# Patient Record
Sex: Female | Born: 1995 | Race: White | Hispanic: No | Marital: Single | Smoking: Never smoker
Health system: Southern US, Community
[De-identification: ages and names within clinical notes are randomized; demographics above are authoritative.]

## PROBLEM LIST (undated history)

## (undated) DIAGNOSIS — F419 Anxiety disorder, unspecified: Secondary | ICD-10-CM

## (undated) HISTORY — DX: Anxiety disorder, unspecified: F41.9

## (undated) HISTORY — PX: WISDOM TOOTH EXTRACTION: SHX21

---
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
## 2017-10-25 ENCOUNTER — Ambulatory Visit
Admission: RE | Admit: 2017-10-25 | Discharge: 2017-10-25 | Disposition: A | Discharge status: Home / Self Care | Payer: BLUE CROSS/BLUE SHIELD | Source: Ambulatory Visit | Attending: Family Medicine | Admitting: Family Medicine

## 2017-10-25 ENCOUNTER — Other Ambulatory Visit: Payer: Self-pay | Admitting: Family Medicine

## 2017-10-25 DIAGNOSIS — R079 Chest pain, unspecified: Secondary | ICD-10-CM | POA: Diagnosis present

## 2018-03-16 ENCOUNTER — Emergency Department: Payer: 59

## 2018-03-16 ENCOUNTER — Emergency Department
Admission: EM | Admit: 2018-03-16 | Discharge: 2018-03-17 | Disposition: A | Discharge status: Home / Self Care | Payer: 59 | Attending: Emergency Medicine | Admitting: Emergency Medicine

## 2018-03-16 ENCOUNTER — Other Ambulatory Visit: Payer: Self-pay

## 2018-03-16 DIAGNOSIS — X58XXXA Exposure to other specified factors, initial encounter: Secondary | ICD-10-CM | POA: Insufficient documentation

## 2018-03-16 DIAGNOSIS — R0789 Other chest pain: Secondary | ICD-10-CM | POA: Insufficient documentation

## 2018-03-16 DIAGNOSIS — S0083XA Contusion of other part of head, initial encounter: Secondary | ICD-10-CM | POA: Insufficient documentation

## 2018-03-16 DIAGNOSIS — F10120 Alcohol abuse with intoxication, uncomplicated: Secondary | ICD-10-CM

## 2018-03-16 DIAGNOSIS — Y929 Unspecified place or not applicable: Secondary | ICD-10-CM | POA: Insufficient documentation

## 2018-03-16 DIAGNOSIS — Y939 Activity, unspecified: Secondary | ICD-10-CM | POA: Diagnosis not present

## 2018-03-16 DIAGNOSIS — Y999 Unspecified external cause status: Secondary | ICD-10-CM | POA: Insufficient documentation

## 2018-03-16 DIAGNOSIS — K2920 Alcoholic gastritis without bleeding: Secondary | ICD-10-CM | POA: Diagnosis not present

## 2018-03-16 DIAGNOSIS — T510X1A Toxic effect of ethanol, accidental (unintentional), initial encounter: Secondary | ICD-10-CM | POA: Insufficient documentation

## 2018-03-16 LAB — URINALYSIS, COMPLETE (UACMP) WITH MICROSCOPIC
BACTERIA UA: NONE SEEN
Bilirubin Urine: NEGATIVE
Glucose, UA: NEGATIVE mg/dL
Hgb urine dipstick: NEGATIVE
KETONES UR: NEGATIVE mg/dL
LEUKOCYTES UA: NEGATIVE
Nitrite: NEGATIVE
PH: 6 (ref 5.0–8.0)
PROTEIN: NEGATIVE mg/dL
Specific Gravity, Urine: 1.028 (ref 1.005–1.030)

## 2018-03-16 LAB — COMPREHENSIVE METABOLIC PANEL
ALBUMIN: 3.9 g/dL (ref 3.5–5.0)
ALT: 19 U/L (ref 0–44)
AST: 28 U/L (ref 15–41)
Alkaline Phosphatase: 49 U/L (ref 38–126)
Anion gap: 4 — ABNORMAL LOW (ref 5–15)
BUN: 11 mg/dL (ref 6–20)
CO2: 28 mmol/L (ref 22–32)
CREATININE: 0.94 mg/dL (ref 0.44–1.00)
Calcium: 9.1 mg/dL (ref 8.9–10.3)
Chloride: 105 mmol/L (ref 98–111)
GFR calc Af Amer: >60 mL/min (ref >60)
GLUCOSE: 119 mg/dL — AB (ref 70–99)
POTASSIUM: 4 mmol/L (ref 3.5–5.1)
Sodium: 137 mmol/L (ref 135–145)
Total Bilirubin: 0.5 mg/dL (ref 0.3–1.2)
Total Protein: 7 g/dL (ref 6.5–8.1)

## 2018-03-16 LAB — URINE DRUG SCREEN, QUALITATIVE (ARMC ONLY)
AMPHETAMINES, UR SCREEN: NOT DETECTED
Barbiturates, Ur Screen: NOT DETECTED
CANNABINOID 50 NG, UR ~~LOC~~: NOT DETECTED
COCAINE METABOLITE, UR ~~LOC~~: NOT DETECTED
MDMA (ECSTASY) UR SCREEN: NOT DETECTED
Methadone Scn, Ur: NOT DETECTED
Opiate, Ur Screen: NOT DETECTED
Phencyclidine (PCP) Ur S: NOT DETECTED
Tricyclic, Ur Screen: NOT DETECTED

## 2018-03-16 LAB — CBC WITH DIFFERENTIAL/PLATELET
BASOS ABS: 0 10*3/uL (ref 0–0.1)
Basophils Relative: 0 %
Eosinophils Absolute: 0.1 10*3/uL (ref 0–0.7)
Eosinophils Relative: 1 %
HCT: 39.3 % (ref 35.0–47.0)
HEMOGLOBIN: 13.6 g/dL (ref 12.0–16.0)
Lymphocytes Relative: 22 %
Lymphs Abs: 2.8 10*3/uL (ref 1.0–3.6)
MCH: 30.3 pg (ref 26.0–34.0)
MCHC: 34.5 g/dL (ref 32.0–36.0)
MCV: 87.8 fL (ref 80.0–100.0)
Monocytes Absolute: 1.1 10*3/uL — ABNORMAL HIGH (ref 0.2–0.9)
Monocytes Relative: 8 %
Neutro Abs: 9.1 10*3/uL — ABNORMAL HIGH (ref 1.4–6.5)
Neutrophils Relative %: 69 %
Platelets: 441 10*3/uL — ABNORMAL HIGH (ref 150–440)
RBC: 4.48 MIL/uL (ref 3.80–5.20)
RDW: 12.2 % (ref 11.5–14.5)
WBC: 13.1 10*3/uL — AB (ref 3.6–11.0)

## 2018-03-16 LAB — ETHANOL

## 2018-03-16 LAB — TROPONIN I

## 2018-03-16 LAB — POCT PREGNANCY, URINE: Preg Test, Ur: NEGATIVE

## 2018-03-16 LAB — PREGNANCY, URINE: Preg Test, Ur: NEGATIVE

## 2018-03-16 MED ORDER — DEXTROSE-NACL 5-0.45 % IV SOLN
INTRAVENOUS | Status: DC
  Administered 2018-03-17: 01:00:00 via INTRAVENOUS

## 2018-03-16 MED ORDER — ONDANSETRON HCL 4 MG/2ML IJ SOLN
4.0000 mg | Freq: Once | INTRAMUSCULAR | Status: DC
  Filled 2018-03-16: qty 2

## 2018-03-16 MED ORDER — FAMOTIDINE IN NACL 20-0.9 MG/50ML-% IV SOLN
20.0000 mg | Freq: Once | INTRAVENOUS | Status: DC
  Filled 2018-03-16: qty 50

## 2018-03-16 NOTE — ED Provider Notes (Signed)
[facility] Emergency Department Provider Note   ____________________________________________   None    (approximate)  I have reviewed the triage vital signs and the nursing notes.   HISTORY  Chief Complaint possible ingestion    HPI Angela Campbell is a 22 y.o. female who presents [facility] the ED from [facility] with a chief complaint of chest pain and feeling groggy.  Reports she went out with friends last evening with heavy drinking.  Thinks she might have fallen because she noticed a bruise on her forehead today.  Awoke with burning epigastric discomfort and noted left-sided chest pain throughout the day.  States she feels groggy and cannot really remember anything.  Denies ingesting more alcohol than usual and does not feel like she is merely having a hangover.  She wonders if someone slipped something into her drink.  "Just feel out of it".  Denies recent fever, chills, cough, shortness of breath, vomiting, dysuria, diarrhea.  Takes OCPs.   Past medical history None  There are no active problems to display for this patient.    Prior to Admission medications   Not on File    Allergies Patient has no known allergies.  No family history on file.  Social History Social History   Tobacco Use  . Smoking status: Not on file  Substance Use Topics  . Alcohol use: Not on file  . Drug use: Not on file  +EtOH  Review of Systems  Constitutional: No fever/chills Eyes: No visual changes. ENT: No sore throat. Cardiovascular: Positive for chest pain. Respiratory: Denies shortness of breath. Gastrointestinal: No abdominal pain.  No nausea, no vomiting.  No diarrhea.  No constipation. Genitourinary: Negative for dysuria. Musculoskeletal: Negative for back pain. Skin: Negative for rash. Neurological: Positive for feeling groggy and "out of it".  Negative for headaches, focal weakness or  numbness.   ____________________________________________   PHYSICAL EXAM:  VITAL SIGNS: ED Triage Vitals  Enc Vitals Group     BP 03/16/18 2108 124/65     Pulse Rate 03/16/18 2108 64     Resp 03/16/18 2108 18     Temp 03/16/18 2108 98.3 F (36.8 C)     Temp Source 03/16/18 2108 Oral     SpO2 03/16/18 2108 100 %     Weight 03/16/18 2108 153 lb (69.4 kg)     Height 03/16/18 2108 5\' 5"  (1.651 m)     Head Circumference --      Peak Flow --      Pain Score 03/16/18 2117 7     Pain Loc --      Pain Edu? --      Excl. in GC? --     Constitutional: Alert and oriented. Well appearing and in no acute distress.  Laughing and visiting with friends. Eyes: Conjunctivae are normal. PERRL. EOMI. Head: Atraumatic.  No evidence of bruising noted. Nose: No external evidence of injury. Mouth/Throat: Mucous membranes are moist.  No dental malocclusion. Neck: No stridor.  No cervical spine tenderness to palpation. Cardiovascular: Normal rate, regular rhythm. Grossly normal heart sounds.  Good peripheral circulation. Respiratory: Normal respiratory effort.  No retractions. Lungs CTAB. Gastrointestinal: Soft and nontender to light or deep palpation. No distention. No abdominal bruits. No CVA tenderness. Musculoskeletal: No lower extremity tenderness nor edema.  No joint effusions. Neurologic: Alert and oriented x3.  CN II to XII grossly intact.  Normal speech and language. No gross focal neurologic deficits are appreciated. No gait instability. Skin:  Skin is warm, dry and intact. No rash noted.  No external evidence of injury noted. Psychiatric: Mood and affect are normal. Speech and behavior are normal.  ____________________________________________   LABS (all labs ordered are listed, but only abnormal results are displayed)  Labs Reviewed  CBC WITH DIFFERENTIAL/PLATELET - Abnormal; Notable for the following components:      Result Value   WBC 13.1 (*)    Platelets 441 (*)    Neutro Abs  9.1 (*)    Monocytes Absolute 1.1 (*)    All other components within normal limits  URINE DRUG SCREEN, QUALITATIVE ([facility] ONLY) - Abnormal; Notable for the following components:   Benzodiazepine, Ur Scrn TEST NOT PERFORMED, REAGENT NOT AVAILABLE (*)    All other components within normal limits  COMPREHENSIVE METABOLIC PANEL - Abnormal; Notable for the following components:   Glucose, Bld 119 (*)    Anion gap 4 (*)    All other components within normal limits  URINALYSIS, COMPLETE (UACMP) WITH MICROSCOPIC - Abnormal; Notable for the following components:   Color, Urine YELLOW (*)    APPearance CLEAR (*)    All other components within normal limits  PREGNANCY, URINE  TROPONIN I  ETHANOL  TROPONIN I  FIBRIN DERIVATIVES D-DIMER ([facility] ONLY)  POCT PREGNANCY, URINE   ____________________________________________  EKG  ED ECG REPORT I, SUNG,JADE J, the attending physician, personally viewed and interpreted this ECG.   Date: 03/16/2018  EKG Time: 2105  Rate: 55  Rhythm: sinus bradycardia  Axis: Normal  Intervals:none  ST&T Change: Nonspecific  ____________________________________________  RADIOLOGY  ED MD interpretation: No acute cardiopulmonary process; no ICH  Official radiology report(s): Dg Chest 2 View  Result Date: 03/16/2018 CLINICAL DATA:  Pt states she was drinking last pm when she drank something and then felt "disoriented and I couldn't really remember anything". Pt states she was vomiting and felt groggy. Pt states she also had chest pain today. EXAM: CHEST - 2 VIEW COMPARISON:  10/25/2017 FINDINGS: The heart size and mediastinal contours are within normal limits. Both lungs are clear. The visualized skeletal structures are unremarkable. IMPRESSION: No active cardiopulmonary disease. Electronically Signed   By: Norva PavlovElizabeth  Brown M.D.   On: 03/16/2018 21:43   Ct Head Wo Contrast  Result Date: 03/17/2018 CLINICAL DATA:  22 year old female with ETOH and fall. EXAM: CT  HEAD WITHOUT CONTRAST TECHNIQUE: Contiguous axial images were obtained from the base of the skull through the vertex without intravenous contrast. COMPARISON:  None. FINDINGS: Brain: No evidence of acute infarction, hemorrhage, hydrocephalus, extra-axial collection or mass lesion/mass effect. Vascular: No hyperdense vessel or unexpected calcification. Skull: Normal. Negative for fracture or focal lesion. Sinuses/Orbits: No acute finding. Other: None IMPRESSION: Normal noncontrast CT of the brain. Electronically Signed   By: Elgie CollardArash  Radparvar M.D.   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 Differential diagnosis includes, but is not limited to, ICH, head injury, CVA, accidental drug ingestion, ACS, aortic dissection, pulmonary embolism, cardiac tamponade, pneumothorax, pneumonia, pericarditis, myocarditis,  GI-related causes including esophagitis/gastritis, and musculoskeletal chest wall pain.    Initial work-up including troponin and EKG negative.  UDS negative.  Will check EtOH, urinalysis, repeat troponin and add d-dimer as patient is taking OCPs.  Will initiate IV fluid resuscitation, 20 mg IV Pepcid for likely alcoholic gastritis and reassess.  Clinical Course as of Mar 17 121  Sat Mar 17, 2018  0118 Patient declined IV Pepcid and Zofran.  She is overall feeling much better and is just eager to go home.   Updated her on negative repeat troponin, d-dimer and CT head results.  Encouraged her to increase her hydration over the next few days and to take over-the-counter Zantac or Pepcid as needed.  Strict return precautions given.  Patient verbalizes understanding and agrees with plan of care.   [JS]    Clinical Course User Index [JS] Irean Hong, MD     ____________________________________________   FINAL CLINICAL IMPRESSION(S) / ED DIAGNOSES  Final diagnoses:  Hangover effect, uncomplicated (HCC)  Acute alcoholic gastritis without hemorrhage     ED Discharge Orders    None       Note:  This document was prepared using Dragon voice recognition software and may include unintentional dictation errors.   Irean Hong, MD 03/17/18 [phone]

## 2018-03-16 NOTE — ED Triage Notes (Signed)
Pt states she was drinking last pm when she drank something and then felt "disoriented and I couldn't really remember anything". Pt states she was vomiting and felt groggy. Pt states she also had chest pain today. Pt states "I just feel out of it".

## 2018-03-17 LAB — TROPONIN I

## 2018-03-17 LAB — FIBRIN DERIVATIVES D-DIMER (ARMC ONLY): Fibrin derivatives D-dimer (ARMC): 229.55 ng/mL (FEU) (ref 0.00–499.00)

## 2018-03-17 NOTE — Discharge Instructions (Signed)
Drink plenty of fluids this weekend.  You may take over-the-counter Pepcid or Zantac as needed. Return to the ER for worsening symptoms, persistent vomiting, difficulty breathing or other concerns.

## 2018-05-15 ENCOUNTER — Emergency Department: Payer: BLUE CROSS/BLUE SHIELD | Admitting: Certified Registered Nurse Anesthetist

## 2018-05-15 ENCOUNTER — Other Ambulatory Visit: Payer: Self-pay

## 2018-05-15 ENCOUNTER — Emergency Department: Payer: BLUE CROSS/BLUE SHIELD

## 2018-05-15 ENCOUNTER — Encounter: Payer: Self-pay | Admitting: Emergency Medicine

## 2018-05-15 ENCOUNTER — Observation Stay
Admission: EM | Admit: 2018-05-15 | Discharge: 2018-05-16 | Disposition: A | Discharge status: Home / Self Care | Payer: BLUE CROSS/BLUE SHIELD | Attending: Surgery | Admitting: Surgery

## 2018-05-15 ENCOUNTER — Encounter
Admission: EM | Disposition: A | Discharge status: Home / Self Care | Payer: Self-pay | Source: Home / Self Care | Attending: Emergency Medicine

## 2018-05-15 DIAGNOSIS — K3580 Unspecified acute appendicitis: Secondary | ICD-10-CM | POA: Diagnosis present

## 2018-05-15 DIAGNOSIS — K353 Acute appendicitis with localized peritonitis, without perforation or gangrene: Secondary | ICD-10-CM | POA: Diagnosis not present

## 2018-05-15 HISTORY — PX: LAPAROSCOPIC APPENDECTOMY: SHX408

## 2018-05-15 LAB — CBC
HCT: 38.6 % (ref 36.0–46.0)
HEMOGLOBIN: 12.8 g/dL (ref 12.0–15.0)
MCH: 30.3 pg (ref 26.0–34.0)
MCHC: 33.2 g/dL (ref 30.0–36.0)
MCV: 91.5 fL (ref 80.0–100.0)
Platelets: 364 10*3/uL (ref 150–400)
RBC: 4.22 MIL/uL (ref 3.87–5.11)
RDW: 12.3 % (ref 11.5–15.5)
WBC: 15.9 10*3/uL — AB (ref 4.0–10.5)
nRBC: 0 % (ref 0.0–0.2)

## 2018-05-15 LAB — COMPREHENSIVE METABOLIC PANEL
ALT: 18 U/L (ref 0–44)
ANION GAP: 8 (ref 5–15)
AST: 20 U/L (ref 15–41)
Albumin: 3.9 g/dL (ref 3.5–5.0)
Alkaline Phosphatase: 57 U/L (ref 38–126)
BILIRUBIN TOTAL: 0.5 mg/dL (ref 0.3–1.2)
BUN: 10 mg/dL (ref 6–20)
CO2: 25 mmol/L (ref 22–32)
Calcium: 8.8 mg/dL — ABNORMAL LOW (ref 8.9–10.3)
Chloride: 102 mmol/L (ref 98–111)
Creatinine, Ser: 0.77 mg/dL (ref 0.44–1.00)
Glucose, Bld: 92 mg/dL (ref 70–99)
POTASSIUM: 3.4 mmol/L — AB (ref 3.5–5.1)
Sodium: 135 mmol/L (ref 135–145)
TOTAL PROTEIN: 7.2 g/dL (ref 6.5–8.1)

## 2018-05-15 LAB — URINALYSIS, COMPLETE (UACMP) WITH MICROSCOPIC
BILIRUBIN URINE: NEGATIVE
Glucose, UA: NEGATIVE mg/dL
Hgb urine dipstick: NEGATIVE
KETONES UR: NEGATIVE mg/dL
NITRITE: NEGATIVE
PH: 6 (ref 5.0–8.0)
PROTEIN: NEGATIVE mg/dL
Specific Gravity, Urine: 1.01 (ref 1.005–1.030)

## 2018-05-15 LAB — LIPASE, BLOOD: Lipase: 25 U/L (ref 11–51)

## 2018-05-15 LAB — POCT PREGNANCY, URINE: Preg Test, Ur: NEGATIVE

## 2018-05-15 SURGERY — APPENDECTOMY, LAPAROSCOPIC
Anesthesia: General | Site: Abdomen

## 2018-05-15 MED ORDER — SUCCINYLCHOLINE CHLORIDE 20 MG/ML IJ SOLN
INTRAMUSCULAR | Status: DC | PRN
  Administered 2018-05-15: 100 mg via INTRAVENOUS

## 2018-05-15 MED ORDER — MIDAZOLAM HCL 2 MG/2ML IJ SOLN
INTRAMUSCULAR | Status: AC
  Filled 2018-05-15: qty 2

## 2018-05-15 MED ORDER — KETOROLAC TROMETHAMINE 30 MG/ML IJ SOLN
INTRAMUSCULAR | Status: AC
  Filled 2018-05-15: qty 1

## 2018-05-15 MED ORDER — SODIUM CHLORIDE 0.9 % IV SOLN
INTRAVENOUS | Status: AC
  Filled 2018-05-15: qty 10

## 2018-05-15 MED ORDER — FENTANYL CITRATE (PF) 100 MCG/2ML IJ SOLN
INTRAMUSCULAR | Status: DC | PRN
  Administered 2018-05-15 (×3): 50 ug via INTRAVENOUS
  Administered 2018-05-16 (×2): 25 ug via INTRAVENOUS

## 2018-05-15 MED ORDER — ONDANSETRON HCL 4 MG/2ML IJ SOLN
INTRAMUSCULAR | Status: AC
  Filled 2018-05-15: qty 2

## 2018-05-15 MED ORDER — PIPERACILLIN-TAZOBACTAM 3.375 G IVPB 30 MIN: 3.3750 g | Freq: Once | INTRAVENOUS | Status: DC

## 2018-05-15 MED ORDER — DEXAMETHASONE SODIUM PHOSPHATE 10 MG/ML IJ SOLN
INTRAMUSCULAR | Status: DC | PRN
  Administered 2018-05-15: 10 mg via INTRAVENOUS

## 2018-05-15 MED ORDER — ACETAMINOPHEN 10 MG/ML IV SOLN
INTRAVENOUS | Status: AC
  Filled 2018-05-15: qty 100

## 2018-05-15 MED ORDER — MIDAZOLAM HCL 2 MG/2ML IJ SOLN
INTRAMUSCULAR | Status: DC | PRN
  Administered 2018-05-15: 2 mg via INTRAVENOUS

## 2018-05-15 MED ORDER — FENTANYL CITRATE (PF) 100 MCG/2ML IJ SOLN
INTRAMUSCULAR | Status: AC
  Filled 2018-05-15: qty 2

## 2018-05-15 MED ORDER — BUPIVACAINE-EPINEPHRINE (PF) 0.5% -1:200000 IJ SOLN
INTRAMUSCULAR | Status: AC
  Filled 2018-05-15: qty 30

## 2018-05-15 MED ORDER — PROPOFOL 10 MG/ML IV BOLUS
INTRAVENOUS | Status: DC | PRN
  Administered 2018-05-15: 40 mg via INTRAVENOUS
  Administered 2018-05-15: 160 mg via INTRAVENOUS

## 2018-05-15 MED ORDER — SODIUM CHLORIDE 0.9 % IV SOLN
1.0000 g | Freq: Once | INTRAVENOUS | Status: AC
  Administered 2018-05-15: 1 g via INTRAVENOUS

## 2018-05-15 MED ORDER — LACTATED RINGERS IV SOLN
INTRAVENOUS | Status: DC | PRN
  Administered 2018-05-15: 23:00:00 via INTRAVENOUS

## 2018-05-15 MED ORDER — DEXAMETHASONE SODIUM PHOSPHATE 10 MG/ML IJ SOLN
INTRAMUSCULAR | Status: AC
  Filled 2018-05-15: qty 1

## 2018-05-15 MED ORDER — SUGAMMADEX SODIUM 200 MG/2ML IV SOLN
INTRAVENOUS | Status: AC
  Filled 2018-05-15: qty 2

## 2018-05-15 MED ORDER — SODIUM CHLORIDE 0.9 % IV SOLN: 1.0000 g | Freq: Once | INTRAVENOUS | Status: DC

## 2018-05-15 MED ORDER — LIDOCAINE HCL (CARDIAC) PF 100 MG/5ML IV SOSY
PREFILLED_SYRINGE | INTRAVENOUS | Status: DC | PRN
  Administered 2018-05-15: 100 mg via INTRAVENOUS

## 2018-05-15 MED ORDER — IOPAMIDOL (ISOVUE-300) INJECTION 61%
100.0000 mL | Freq: Once | INTRAVENOUS | Status: AC | PRN
  Administered 2018-05-15: 100 mL via INTRAVENOUS
  Filled 2018-05-15: qty 100

## 2018-05-15 MED ORDER — ACETAMINOPHEN 10 MG/ML IV SOLN
INTRAVENOUS | Status: DC | PRN
  Administered 2018-05-15: 1000 mg via INTRAVENOUS

## 2018-05-15 MED ORDER — ROCURONIUM BROMIDE 100 MG/10ML IV SOLN
INTRAVENOUS | Status: DC | PRN
  Administered 2018-05-15: 10 mg via INTRAVENOUS
  Administered 2018-05-15: 30 mg via INTRAVENOUS

## 2018-05-15 SURGICAL SUPPLY — 38 items
BLADE SURG 15 STRL LF DISP TIS (BLADE) IMPLANT
BLADE SURG 15 STRL SS (BLADE)
BLADE SURG SZ11 CARB STEEL (BLADE) IMPLANT
CANISTER SUCT 1200ML W/VALVE (MISCELLANEOUS) ×3 IMPLANT
CANNULA DILATOR 10 W/SLV (CANNULA) ×2 IMPLANT
CANNULA DILATOR 10MM W/SLV (CANNULA) ×1
COVER WAND RF STERILE (DRAPES) IMPLANT
CUTTER FLEX LINEAR 45M (STAPLE) ×3 IMPLANT
DERMABOND ADVANCED (GAUZE/BANDAGES/DRESSINGS) ×2
DERMABOND ADVANCED .7 DNX12 (GAUZE/BANDAGES/DRESSINGS) ×1 IMPLANT
ELECT REM PT RETURN 9FT ADLT (ELECTROSURGICAL) ×3
ELECTRODE REM PT RTRN 9FT ADLT (ELECTROSURGICAL) ×1 IMPLANT
GLOVE BIOGEL PI IND STRL 7.0 (GLOVE) ×2 IMPLANT
GLOVE BIOGEL PI INDICATOR 7.0 (GLOVE) ×4
GLOVE SURG SYN 7.0 (GLOVE) ×9 IMPLANT
GOWN STRL REUS W/ TWL LRG LVL3 (GOWN DISPOSABLE) ×2 IMPLANT
GOWN STRL REUS W/TWL LRG LVL3 (GOWN DISPOSABLE) ×4
GRASPER SUT TROCAR 14GX15 (MISCELLANEOUS) ×3 IMPLANT
HANDLE YANKAUER SUCT BULB TIP (MISCELLANEOUS) ×3 IMPLANT
IRRIGATION STRYKERFLOW (MISCELLANEOUS) IMPLANT
IRRIGATOR STRYKERFLOW (MISCELLANEOUS)
KIT TURNOVER KIT A (KITS) ×3 IMPLANT
LIGASURE LAP MARYLAND 5MM 37CM (ELECTROSURGICAL) IMPLANT
NEEDLE HYPO 22GX1.5 SAFETY (NEEDLE) ×3 IMPLANT
NEEDLE VERESS 14GA 120MM (NEEDLE) ×3 IMPLANT
PACK LAP CHOLECYSTECTOMY (MISCELLANEOUS) ×3 IMPLANT
POUCH ENDO CATCH 10MM SPEC (MISCELLANEOUS) ×3 IMPLANT
RELOAD 45 VASCULAR/THIN (ENDOMECHANICALS) ×3 IMPLANT
RELOAD STAPLE TA45 3.5 REG BLU (ENDOMECHANICALS) ×3 IMPLANT
SCISSORS METZENBAUM CVD 33 (INSTRUMENTS) IMPLANT
SUT MNCRL AB 4-0 PS2 18 (SUTURE) ×3 IMPLANT
SUT VIC AB 3-0 SH 27 (SUTURE) ×2
SUT VIC AB 3-0 SH 27X BRD (SUTURE) ×1 IMPLANT
SUT VICRYL PLUS ABS 0 54 (SUTURE) ×6 IMPLANT
TRAY FOLEY MTR SLVR 16FR STAT (SET/KITS/TRAYS/PACK) ×3 IMPLANT
TROCAR XCEL 12X100 BLDLESS (ENDOMECHANICALS) ×3 IMPLANT
TROCAR XCEL NON-BLD 5MMX100MML (ENDOMECHANICALS) ×6 IMPLANT
TUBING INSUFFLATION (TUBING) ×3 IMPLANT

## 2018-05-15 NOTE — Consult Note (Signed)
Subjective:   CC: appendicitis, acute  HPI:  Angela Campbell is a 22 y.o. female who was consulted by williams for issue above.  Symptoms were first noted 2 days ago. Pain is sharp, confined to the RLQ, without radiation.  Associated with nothing specific, exacerbated by nothing specific.     Past Medical History: denies any medical history  Past Surgical History:  Past Surgical History:  Procedure Laterality Date  . WISDOM TOOTH EXTRACTION      Family History: reviewed and not pertinent to current CC  Social History:  reports that she has never smoked. She has never used smokeless tobacco. She reports that she drinks alcohol. She reports that she does not use drugs.  Current Medications: not taking anything  Allergies:  NKDA  ROS:  A 15 point review of systems was performed and pertinent positives and negatives noted in HPI   Objective:     BP 122/79 (BP Location: Left Arm)   Pulse 82   Temp 98.5 F (36.9 C) (Oral)   Resp 20   Ht 5\' 5"  (1.651 m)   Wt 70.8 kg   LMP 05/01/2018   SpO2 97%   BMI 25.96 kg/m   Constitutional :  alert, cooperative, appears stated age and no distress  Lymphatics/Throat:  no asymmetry, masses, or scars  Respiratory:  clear to auscultation bilaterally  Cardiovascular:  regular rate and rhythm  Gastrointestinal: soft, no guarding, but focal tenderness noted in RLQ.   Musculoskeletal: Steady gait and movement  Skin: Cool and moist   Psychiatric: Normal affect, non-agitated, not confused       LABS:  CMP Latest Ref Rng & Units 05/15/2018 03/16/2018  Glucose 70 - 99 mg/dL 92 161(W)  BUN 6 - 20 mg/dL 10 11  Creatinine 9.60 - 1.00 mg/dL 4.54 0.98  Sodium 119 - 145 mmol/L 135 137  Potassium 3.5 - 5.1 mmol/L 3.4(L) 4.0  Chloride 98 - 111 mmol/L 102 105  CO2 22 - 32 mmol/L 25 28  Calcium 8.9 - 10.3 mg/dL 1.4(N) 9.1  Total Protein 6.5 - 8.1 g/dL 7.2 7.0  Total Bilirubin 0.3 - 1.2 mg/dL 0.5 0.5  Alkaline Phos 38 - 126 U/L 57 49  AST 15 - 41  U/L 20 28  ALT 0 - 44 U/L 18 19   CBC Latest Ref Rng & Units 05/15/2018 03/16/2018  WBC 4.0 - 10.5 K/uL 15.9(H) 13.1(H)  Hemoglobin 12.0 - 15.0 g/dL 82.9 56.2  Hematocrit 13.0 - 46.0 % 38.6 39.3  Platelets 150 - 400 K/uL 364 441(H)    RADS: CLINICAL DATA:  Right lower quadrant abdomen pain for 2 days.  EXAM: CT ABDOMEN AND PELVIS WITH CONTRAST  TECHNIQUE: Multidetector CT imaging of the abdomen and pelvis was performed using the standard protocol following bolus administration of intravenous contrast.  CONTRAST:  ISOVUE-300 IOPAMIDOL (ISOVUE-300) INJECTION 61%  COMPARISON:  None.  FINDINGS: Lower chest: No acute abnormality.  Hepatobiliary: No focal liver abnormality is seen. No gallstones, gallbladder wall thickening, or biliary dilatation.  Pancreas: Unremarkable. No pancreatic ductal dilatation or surrounding inflammatory changes.  Spleen: Normal in size without focal abnormality.  Adrenals/Urinary Tract: Adrenal glands are unremarkable. Kidneys are normal, without renal calculi, focal lesion, or hydronephrosis. Bladder is unremarkable.  Stomach/Bowel: The appendix is enlarged with enhancing wall and surrounding inflammation consistent with appendicitis. Small amount of free fluid is identified in the pelvis. There is no small bowel obstruction. The colon is normal. The stomach is normal.  Vascular/Lymphatic: No significant  vascular findings are present. No enlarged abdominal or pelvic lymph nodes.  Reproductive: Uterus and bilateral adnexa are unremarkable.  Other: None.  Musculoskeletal: No acute or significant osseous findings.  IMPRESSION: Acute appendicitis.  These results will be called to the ordering clinician or representative by the Radiologist Assistant, and communication documented in the PACS or zVision Dashboard.   Electronically Signed   By: Sherian Rein M.D.   On: 05/15/2018 21:04 Assessment:   Acute  appendicitis, non-perforated  Plan:     Discussed pathophisiology and treatment options including ABX,  and possible surgery for lysis of , laparoscopic or open.  The risk of surgery include, but not limited to, bleeding, chronic pain, post-op infxn, post-op SBO or ileus, hernias, resection of bowel, need for re-operation to address said risks. The risks of general anesthetic, if used, includes MI, CVA, sudden death or even reaction to anesthetic medications also discussed. Alternatives include continued observation and/or abx.  Benefits include possible symptom relief, preventing further decline in health.  Typical post-op recovery time of additional days in hospital for observation afterwards also discussed.  The patient verbalized understanding and all questions were answered to the patient's satisfaction.  Will proceed with lap appy.

## 2018-05-15 NOTE — Anesthesia Preprocedure Evaluation (Signed)
Anesthesia Evaluation  Patient identified by MRN, date of birth, ID band Patient awake    Reviewed: Allergy & Precautions, NPO status , Patient's Chart, lab work & pertinent test results  Airway Mallampati: II  TM Distance: >3 FB     Dental  (+) Teeth Intact   Pulmonary neg pulmonary ROS,    Pulmonary exam normal        Cardiovascular negative cardio ROS Normal cardiovascular exam     Neuro/Psych negative neurological ROS  negative psych ROS   GI/Hepatic Neg liver ROS,   Endo/Other  negative endocrine ROS  Renal/GU negative Renal ROS  negative genitourinary   Musculoskeletal negative musculoskeletal ROS (+)   Abdominal Normal abdominal exam  (+)   Peds negative pediatric ROS (+)  Hematology negative hematology ROS (+)   Anesthesia Other Findings History reviewed. No pertinent past medical history.  Reproductive/Obstetrics                             Anesthesia Physical Anesthesia Plan  ASA: I and emergent  Anesthesia Plan: General   Post-op Pain Management:    Induction: Intravenous, Rapid sequence and Cricoid pressure planned  PONV Risk Score and Plan:   Airway Management Planned: Oral ETT  Additional Equipment:   Intra-op Plan:   Post-operative Plan: Extubation in OR  Informed Consent: I have reviewed the patients History and Physical, chart, labs and discussed the procedure including the risks, benefits and alternatives for the proposed anesthesia with the patient or authorized representative who has indicated his/her understanding and acceptance.   Dental advisory given  Plan Discussed with: CRNA and Surgeon  Anesthesia Plan Comments:         Anesthesia Quick Evaluation

## 2018-05-15 NOTE — Anesthesia Post-op Follow-up Note (Signed)
Anesthesia QCDR form completed.        

## 2018-05-15 NOTE — Anesthesia Procedure Notes (Addendum)
Procedure Name: Intubation Performed by: Demetrius Charity, CRNA Pre-anesthesia Checklist: Patient identified, Patient being monitored, Timeout performed, Emergency Drugs available and Suction available Patient Re-evaluated:Patient Re-evaluated prior to induction Oxygen Delivery Method: Circle system utilized Preoxygenation: Pre-oxygenation with 100% oxygen Induction Type: IV induction and Cricoid Pressure applied Ventilation: Mask ventilation without difficulty Laryngoscope Size: Mac and 3 Grade View: Grade I Tube type: Oral Tube size: 7.0 mm Number of attempts: 1 Airway Equipment and Method: Stylet Placement Confirmation: ETT inserted through vocal cords under direct vision,  positive ETCO2 and breath sounds checked- equal and bilateral Secured at: 21 cm Tube secured with: Tape Dental Injury: Teeth and Oropharynx as per pre-operative assessment

## 2018-05-15 NOTE — ED Triage Notes (Addendum)
Pt presents to ED with c/o right lower abd pain for the past 2 days. Been seen at her schools MD office but states pain is worsening. +nasuea and diarrhea Thursday - Sunday. Pain increases with movement and with palpation. Denies fever.

## 2018-05-15 NOTE — ED Provider Notes (Signed)
[facility] Emergency Department Provider Note       Time seen: ----------------------------------------- 8:13 PM on 05/15/2018 -----------------------------------------   I have reviewed the triage vital signs and the nursing notes.  HISTORY   Chief Complaint Abdominal Pain    HPI Angela Campbell is a 22 y.o. female with no significant past medical history who presents to the ED for right lower quadrant pain for the past 2 days.  Patient had been seen at her school's doctor's office but states the pain is worsening.  She had nausea and diarrhea Thursday through Sunday.  Pain increases with movement and with palpation, she denies any fevers, chills or other complaints.  History reviewed. No pertinent past medical history.  There are no active problems to display for this patient.   Past Surgical History:  Procedure Laterality Date  . WISDOM TOOTH EXTRACTION      Allergies Patient has no known allergies.  Social History Social History   Tobacco Use  . Smoking status: Never Smoker  . Smokeless tobacco: Never Used  Substance Use Topics  . Alcohol use: Yes  . Drug use: Never   Review of Systems Constitutional: Negative for fever. Cardiovascular: Negative for chest pain. Respiratory: Negative for shortness of breath. Gastrointestinal: Positive for abdominal pain, recent nausea and diarrhea Genitourinary: Positive for dysuria Musculoskeletal: Negative for back pain. Skin: Negative for rash. Neurological: Negative for headaches, focal weakness or numbness.  All systems negative/normal/unremarkable except as stated in the HPI  ____________________________________________   PHYSICAL EXAM:  VITAL SIGNS: ED Triage Vitals  Enc Vitals Group     BP 05/15/18 1955 122/79     Pulse Rate 05/15/18 1955 82     Resp 05/15/18 1955 20     Temp 05/15/18 1955 98.5 F (36.9 C)     Temp Source 05/15/18 1955 Oral     SpO2 05/15/18 1955 97 %     Weight  05/15/18 1954 156 lb (70.8 kg)     Height 05/15/18 1954 5\' 5"  (1.651 m)     Head Circumference --      Peak Flow --      Pain Score 05/15/18 1954 7     Pain Loc --      Pain Edu? --      Excl. in GC? --    Constitutional: Alert and oriented. Well appearing and in no distress. Eyes: Conjunctivae are normal. Normal extraocular movements. Cardiovascular: Normal rate, regular rhythm. No murmurs, rubs, or gallops. Respiratory: Normal respiratory effort without tachypnea nor retractions. Breath sounds are clear and equal bilaterally. No wheezes/rales/rhonchi. Gastrointestinal: Right lower quadrant tenderness, no rebound or guarding.  Normal bowel sounds. Musculoskeletal: Nontender with normal range of motion in extremities. No lower extremity tenderness nor edema. Neurologic:  Normal speech and language. No gross focal neurologic deficits are appreciated.  Skin:  Skin is warm, dry and intact. No rash noted. ____________________________________________  ED COURSE:  As part of my medical decision making, I reviewed the following data within the electronic MEDICAL RECORD NUMBER History obtained from family if available, nursing notes, old chart and ekg, as well as notes from prior ED visits. Patient presented for abdominal pain, we will assess with labs and imaging as indicated at this time.   Procedures ____________________________________________   LABS (pertinent positives/negatives)  Labs Reviewed  COMPREHENSIVE METABOLIC PANEL - Abnormal; Notable for the following components:      Result Value   Potassium 3.4 (*)    Calcium 8.8 (*)  All other components within normal limits  CBC - Abnormal; Notable for the following components:   WBC 15.9 (*)    All other components within normal limits  URINALYSIS, COMPLETE (UACMP) WITH MICROSCOPIC - Abnormal; Notable for the following components:   Color, Urine YELLOW (*)    APPearance HAZY (*)    Leukocytes, UA LARGE (*)    Bacteria, UA FEW (*)     All other components within normal limits  LIPASE, BLOOD  POCT PREGNANCY, URINE  POC URINE PREG, ED    RADIOLOGY Images were viewed by me  CT of abdomen pelvis with contrast IMPRESSION: Acute appendicitis. ____________________________________________  DIFFERENTIAL DIAGNOSIS   Appendicitis, renal colic, UTI, pyelonephritis, PID, ovarian torsion, ovarian cyst  FINAL ASSESSMENT AND PLAN  Abdominal pain, acute appendicitis   Plan: The patient had presented for right lower quadrant pain. Patient's labs reveal leukocytosis. Patient's imaging was positive for acute appendicitis.  I have ordered IV antibiotics and I will discuss with general surgery for admission.   Ulice Dash, MD   Note: This note was generated in part or whole with voice recognition software. Voice recognition is usually quite accurate but there are transcription errors that can and very often do occur. I apologize for any typographical errors that were not detected and corrected.     Emily Filbert, MD 05/15/18 2113

## 2018-05-15 NOTE — ED Notes (Signed)
Patient undressed into hospital gown and signed consent for surgery

## 2018-05-16 ENCOUNTER — Other Ambulatory Visit: Payer: Self-pay

## 2018-05-16 ENCOUNTER — Encounter: Payer: Self-pay | Admitting: Surgery

## 2018-05-16 DIAGNOSIS — K3580 Unspecified acute appendicitis: Secondary | ICD-10-CM | POA: Diagnosis present

## 2018-05-16 LAB — CREATININE, SERUM
CREATININE: 0.74 mg/dL (ref 0.44–1.00)
GFR calc Af Amer: >60 mL/min (ref >60)

## 2018-05-16 LAB — CBC
HCT: 36.9 % (ref 36.0–46.0)
Hemoglobin: 12.2 g/dL (ref 12.0–15.0)
MCH: 30.1 pg (ref 26.0–34.0)
MCHC: 33.1 g/dL (ref 30.0–36.0)
MCV: 91.1 fL (ref 80.0–100.0)
PLATELETS: 352 10*3/uL (ref 150–400)
RBC: 4.05 MIL/uL (ref 3.87–5.11)
RDW: 12.2 % (ref 11.5–15.5)
WBC: 22.7 10*3/uL — AB (ref 4.0–10.5)
nRBC: 0 % (ref 0.0–0.2)

## 2018-05-16 MED ORDER — SUGAMMADEX SODIUM 200 MG/2ML IV SOLN
INTRAVENOUS | Status: DC | PRN
  Administered 2018-05-16: 141.6 mg via INTRAVENOUS

## 2018-05-16 MED ORDER — MONTELUKAST SODIUM 10 MG PO TABS
10.0000 mg | ORAL_TABLET | Freq: Every day | ORAL | Status: DC
  Administered 2018-05-16: 10 mg via ORAL
  Filled 2018-05-16: qty 1

## 2018-05-16 MED ORDER — LORATADINE 10 MG PO TABS
10.0000 mg | ORAL_TABLET | Freq: Every day | ORAL | Status: DC
  Administered 2018-05-16: 10 mg via ORAL
  Filled 2018-05-16: qty 1

## 2018-05-16 MED ORDER — IBUPROFEN 400 MG PO TABS
800.0000 mg | ORAL_TABLET | Freq: Four times a day (QID) | ORAL | Status: DC | PRN
  Administered 2018-05-16 (×2): 800 mg via ORAL
  Filled 2018-05-16 (×2): qty 2

## 2018-05-16 MED ORDER — TRAMADOL HCL 50 MG PO TABS: 50.0000 mg | ORAL_TABLET | Freq: Four times a day (QID) | ORAL | 0 refills | Status: AC | PRN

## 2018-05-16 MED ORDER — IBUPROFEN 800 MG PO TABS: 800.0000 mg | ORAL_TABLET | Freq: Four times a day (QID) | ORAL | 0 refills | Status: DC | PRN

## 2018-05-16 MED ORDER — TRAMADOL HCL 50 MG PO TABS
50.0000 mg | ORAL_TABLET | Freq: Four times a day (QID) | ORAL | Status: DC | PRN
  Administered 2018-05-16 (×2): 50 mg via ORAL
  Filled 2018-05-16 (×2): qty 1

## 2018-05-16 MED ORDER — FENTANYL CITRATE (PF) 100 MCG/2ML IJ SOLN
25.0000 ug | INTRAMUSCULAR | Status: DC | PRN
  Administered 2018-05-16 (×4): 25 ug via INTRAVENOUS

## 2018-05-16 MED ORDER — ONDANSETRON HCL 4 MG/2ML IJ SOLN
INTRAMUSCULAR | Status: DC | PRN
  Administered 2018-05-16: 4 mg via INTRAVENOUS

## 2018-05-16 MED ORDER — KETOROLAC TROMETHAMINE 30 MG/ML IJ SOLN
INTRAMUSCULAR | Status: DC | PRN
  Administered 2018-05-16: 30 mg via INTRAVENOUS

## 2018-05-16 MED ORDER — FENTANYL CITRATE (PF) 100 MCG/2ML IJ SOLN
INTRAMUSCULAR | Status: AC
  Administered 2018-05-16: 25 ug via INTRAVENOUS
  Filled 2018-05-16: qty 2

## 2018-05-16 MED ORDER — DOCUSATE SODIUM 100 MG PO CAPS: 100.0000 mg | ORAL_CAPSULE | Freq: Two times a day (BID) | ORAL | 0 refills | Status: AC | PRN

## 2018-05-16 MED ORDER — ENOXAPARIN SODIUM 40 MG/0.4ML ~~LOC~~ SOLN: 40.0000 mg | SUBCUTANEOUS | Status: DC

## 2018-05-16 MED ORDER — NORETHIN ACE-ETH ESTRAD-FE 1-20 MG-MCG(24) PO TABS: 1.0000 | ORAL_TABLET | Freq: Every day | ORAL | Status: DC

## 2018-05-16 MED ORDER — BUPIVACAINE-EPINEPHRINE 0.5% -1:200000 IJ SOLN
INTRAMUSCULAR | Status: DC | PRN
  Administered 2018-05-16: 22 mL

## 2018-05-16 MED ORDER — ACETAMINOPHEN 325 MG PO TABS: 650.0000 mg | ORAL_TABLET | Freq: Three times a day (TID) | ORAL | 0 refills | Status: AC | PRN

## 2018-05-16 MED ORDER — ONDANSETRON HCL 4 MG/2ML IJ SOLN: 4.0000 mg | Freq: Once | INTRAMUSCULAR | Status: DC | PRN

## 2018-05-16 NOTE — Discharge Instructions (Signed)
Laparoscopic Appendectomy, Adult, Care After These instructions give you information about caring for yourself after your procedure. Your doctor may also give you more specific instructions. Call your doctor if you have any problems or questions after your procedure. Follow these instructions at home: Medicines  tylenol and advil as needed for discomfort.  Please alternate between the two every four hours as needed for pain.    Use narcotics, if prescribed, only when tylenol and motrin is not enough to control pain.  325-650mg  every 8hrs to max of 4000mg /24hrs for the tylenol.    Advil up to 800mg  per dose every 8hrs as needed for pain.    Do not drive for 24 hours if you received a sedative.  Do not drive or use heavy machinery while taking prescription pain medicine. Activity  Do not play contact sports for 3 weeks or as told by your doctor.  Slowly return to your normal activities. Bathing  Keep your cuts from surgery (incisions) clean and dry. ? Gently wash the cuts with soap and water. ? Rinse the cuts with water until the soap is gone. ? Pat the cuts dry with a clean towel. Do not rub the cuts.  You may take showers after tomorrow.  Do not take baths, swim, or use a hot tub for 2 weeks or as told by your doctor. Cut Care  Follow instructions from your doctor about how to take care of your cuts. Make sure you: ? Wash your hands with soap and water before you change your bandage (dressing). If you do not have soap and water, use hand sanitizer. ? Change your bandage as told by your doctor. ? Leave stitches (sutures), skin glue, or skin tape (adhesive) strips in place. They may need to stay in place for 2 weeks or longer. If tape strips get loose and curl up, you may trim the loose edges. Do not remove tape strips completely unless your doctor says it is okay.  Check your cuts every day for signs of infection. Check for: ? More redness, swelling, or pain. ? More fluid or  blood. ? Warmth. ? Pus or a bad smell. Other Instructions  If you were sent home with a drain, follow instructions from your doctor about how to use it and care for it.  Take deep breaths. This helps to keep your lungs from getting swollen (inflamed).  To help with constipation: ? Drink plenty of fluids. ? Eat plenty of fruits and vegetables.  Keep all follow-up visits as told by your doctor. This is important. Contact a doctor if:  You have more redness, swelling, or pain around a cut from surgery.  You have more fluid or blood coming from a cut.  Your cut feels warm to the touch.  You have pus or a bad smell coming from a cut or a bandage.  The edges of a cut break open after the stitches have been taken out.  You have pain in your shoulders that gets worse.  You feel dizzy or you pass out (faint).  You have shortness of breath.  You keep feeling sick to your stomach (nauseous).  You keep throwing up (vomiting).  You get diarrhea or you cannot control your poop.  You lose your appetite.  You have swelling or pain in your legs. Get help right away if:  You have a fever.  You get a rash.  You have trouble breathing.  You have sharp pains in your chest. This information is not  intended to replace advice given to you by your health care provider. Make sure you discuss any questions you have with your health care provider. Document Released: 04/30/2009 Document Revised: 12/10/2015 Document Reviewed: 12/22/2014 Elsevier Interactive Patient Education  2018 [employer].

## 2018-05-16 NOTE — Transfer of Care (Signed)
Immediate Anesthesia Transfer of Care Note  Patient: Angela Campbell  Procedure(s) Performed: APPENDECTOMY LAPAROSCOPIC (N/A Abdomen)  Patient Location: PACU  Anesthesia Type:General  Level of Consciousness: awake and alert   Airway & Oxygen Therapy: Patient Spontanous Breathing and Patient connected to face mask oxygen  Post-op Assessment: Report given to RN and Post -op Vital signs reviewed and stable  Post vital signs: Reviewed and stable  Last Vitals:  Vitals Value Taken Time  BP    Temp    Pulse    Resp    SpO2      Last Pain:  Vitals:   05/15/18 1955  TempSrc: Oral  PainSc:          Complications: No apparent anesthesia complications

## 2018-05-16 NOTE — Progress Notes (Signed)
Patient received from PACU. Incision sites clean and dry. Medicated for pain x2 with better relief with Tramadol. Patient did ambulate around the nurses station today.

## 2018-05-16 NOTE — Progress Notes (Signed)
Notified Dr. Tonna Boehringer that patient is requesting something for pain and has nothing ordered. MD to place orders.

## 2018-05-16 NOTE — Op Note (Signed)
Preoperative diagnosis: Acute appendicitis.  Postoperative diagnosis: Acute appendicitis  Procedure: Laparoscopic appendectomy.  Anesthesia: GETA  Surgeon: Sung Amabile  Wound Classification: clean contaminated  Specimen: Appendix  Complications: None  Estimated Blood Loss: 3 mL   Indications: Patient is a 22 y.o. female  presented with right lower quadrant pain.  Computed tomography scan and physical examination were consistent with acute appendicitis.   Findings: 1. Acutely inflamed appendix 2. No peri-appendiceal abscess or phlegmon 3. Normal anatomy 4. Appendiceal artery ligated and divided with EndoGIA 5. Adequate hemostasis.   Description of procedure: The patient was placed on the operating table in the supine position, left arm tucked. General anesthesia was induced. A time-out was completed verifying correct patient, procedure, site, positioning, and implant(s) and/or special equipment prior to beginning this procedure. A Foley catheter placed. The abdomen was prepped and draped in the usual sterile fashion.   After local was infused, an incision was made inferior to the umbilicus.  The fascia was elevated with Coker's and 0 Vicryl stay sutures were placed on either side of the midline.  11 blade was then used to make an midline incision down through the fascia blunt dissection used to enter the peritoneal cavity under direct visualization.  Hasson port then placed through this and abdomen insufflated with carbon dioxide to a pressure of 15 mmHg. The patient tolerated insufflation well.  The laparoscope was inserted and the abdomen inspected. No injuries from initial trocar placement were noted. One 5mm port and another 5-mm port was then placed above the symphysis pubis on midline and LLQ.  Care was taken to avoid injury to the bladder or inferior epigastric vessels. The table was placed in the Trendelenburg position with the right side elevated.  An inflamed appendix was  identified and elevated.  Window created at base of appendix in the mesentery.   An endoscopic blue load linear cutting stapler was then used to divide and staple the base of the appendix. It was reloaded with a vascular cartridge and the mesoappendix similarly divided.  The appendix was placed in an endoscopic retrieval bag and removed.   The appendiceal stump was examined and hemostasis noted. Scant serosanguinous fluid and minimal blood suctioned out.  No other pathology was identified within pelvis.  trocars were removed under direct vision. No bleeding was noted. The laparoscope was withdrawn and the umbilical trocar removed. The abdomen was allowed to collapse. 12 mm trocar site closed with Vicryl 0 in figure eight fashion.  Deep dermal then closed with 3-0 vicryl interrupted fashion.  All skin incisions then closed with subcuticular sutures Monocryl 4-0.  Wounds then dressed with dermabond.  The patient tolerated the procedure well, foley removed, awakened from anesthesia and was taken to the postanesthesia care unit in satisfactory condition.  Sponge count and instrument count correct at the end of the procedure.

## 2018-05-16 NOTE — Discharge Summary (Signed)
Physician Discharge Summary  Patient ID: Angela Campbell MRN: [id number] DOB/AGE: 1996/05/24 22 y.o.  Admit date: 05/15/2018 Discharge date: 05/16/2018  Admission Diagnoses: acute appendicitis  Discharge Diagnoses:  Same as above  Discharged Condition: good  Hospital Course: uncomplicated acute appendicitis diagnoses, taken to OR for lap appy, recovered without issue.  Consults: None  Discharge Exam: Blood pressure 113/61, pulse 60, temperature 97.8 F (36.6 C), temperature source Oral, resp. rate 16, height 5\' 5"  (1.651 m), weight 70.8 kg, last menstrual period 05/01/2018, SpO2 100 %. General appearance: alert, cooperative and no distress GI: soft, non-tender; bowel sounds normal; no masses,  no organomegaly port site incisions c/d/i  Disposition:  Discharge disposition: 01-Home or Self Care       Discharge Instructions    Discharge patient   Complete by:  As directed    Discharge disposition:  01-Home or Self Care   Discharge patient date:  05/16/2018     Allergies as of 05/16/2018   No Known Allergies     Medication List    TAKE these medications   acetaminophen 325 MG tablet Commonly known as:  TYLENOL Take 2 tablets (650 mg total) by mouth every 8 (eight) hours as needed for mild pain.   BLISOVI 24 FE 1-20 MG-MCG(24) tablet Generic drug:  Norethindrone Acetate-Ethinyl Estrad-FE Take 1 tablet by mouth daily.   cetirizine 10 MG tablet Commonly known as:  ZYRTEC Take 10 mg by mouth daily.   docusate sodium 100 MG capsule Commonly known as:  COLACE Take 1 capsule (100 mg total) by mouth 2 (two) times daily as needed for up to 10 days for mild constipation.   ibuprofen 800 MG tablet Commonly known as:  ADVIL,MOTRIN Take 1 tablet (800 mg total) by mouth every 6 (six) hours as needed for mild pain or moderate pain.   montelukast 10 MG tablet Commonly known as:  SINGULAIR Take 1 tablet by mouth daily.   traMADol 50 MG tablet Commonly known as:   ULTRAM Take 1 tablet (50 mg total) by mouth every 6 (six) hours as needed for up to 3 days for severe pain.      Follow-up Information    Tonna Boehringer, Baruc Tugwell, DO Follow up in 2 week(s).   Specialty:  Surgery Contact information: [address] [place] [address] [phone]            Total time spent arranging discharge was >4min. Signed: Sung Amabile 05/16/2018, 3:20 PM

## 2018-05-16 NOTE — Anesthesia Postprocedure Evaluation (Signed)
Anesthesia Post Note  Patient: Tax inspector  Procedure(s) Performed: APPENDECTOMY LAPAROSCOPIC (N/A Abdomen)  Patient location during evaluation: PACU Anesthesia Type: General Level of consciousness: awake and alert and oriented Pain management: pain level controlled Vital Signs Assessment: post-procedure vital signs reviewed and stable Respiratory status: spontaneous breathing Cardiovascular status: blood pressure returned to baseline Anesthetic complications: no     Last Vitals:  Vitals:   05/16/18 0246 05/16/18 0344  BP: 124/71 125/72  Pulse: (!) 58 67  Resp: 17 18  Temp: 36.8 C 36.8 C  SpO2: 97% 98%    Last Pain:  Vitals:   05/16/18 0405  TempSrc:   PainSc: 4                  Morell Mears

## 2018-05-19 LAB — SURGICAL PATHOLOGY

## 2018-08-28 ENCOUNTER — Ambulatory Visit (INDEPENDENT_AMBULATORY_CARE_PROVIDER_SITE_OTHER): Payer: BLUE CROSS/BLUE SHIELD | Admitting: Child and Adolescent Psychiatry

## 2018-08-28 ENCOUNTER — Other Ambulatory Visit: Payer: Self-pay

## 2018-08-28 ENCOUNTER — Encounter: Payer: Self-pay | Admitting: Child and Adolescent Psychiatry

## 2018-08-28 VITALS — BP 109/71 | HR 56 | Temp 98.7°F | Wt 169.2 lb

## 2018-08-28 DIAGNOSIS — F411 Generalized anxiety disorder: Secondary | ICD-10-CM

## 2018-08-28 DIAGNOSIS — F902 Attention-deficit hyperactivity disorder, combined type: Secondary | ICD-10-CM | POA: Diagnosis not present

## 2018-08-28 NOTE — Progress Notes (Signed)
Psychiatric Initial Adult Assessment   Patient Identification: Colletta MarylandMegan Highland MRN:  [id number] Date of Evaluation:  08/28/2018 Referral Source: Eilleen KempfPamela Tourangeu (Therapist) Chief Complaint:   Chief Complaint    Establish Care; Anxiety; Depression; ADD    "focusing in the school..." Visit Diagnosis:    ICD-10-CM   1. Generalized anxiety disorder F41.1   2. 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  Aundra MilletMegan reports that she has been having more difficulties with focusing at the school, having trouble sitting in 1 place and do her work, paying attention in class, being fidgety, "I feel like I have to move", hard to do a task without taking a break.  She also reports that it has been difficult to keep track of thoughts sometimes and feels like her thoughts sometimes races.  She reports that this had started from junior year of high school and has gradually worsened especially since she started college at [employer] in her first semester.   She reports that during her first semester at [employer], she had health issues related to mold in her dormitory room.  She reports that she and her roommate both got sick with adenovirus.  She reports that her roommate had Crohn's disease and died because of adenovirus infection.  She reports that she started having severe anxiety about getting sick.  She reports that this was the reason why she transferred from [employer] to [employer].  She also reports that her  brother attended [employer] and she liked the campus when she visited and that was another reason for her transferring to the [employer].  She reports that she started seeing therapist since moving to [employer] and her anxiety has significantly decreased since last year.  She reports that she is still gets anxious intermittently which she manages with exercising, driving her car, listening to music or talking to her family members.  She reports that her anxiety is manageable and has not been impacting her functioning overall.  She reports that her anxiety is triggered when she struggles academically rather than getting scared of getting sick.  She rates her anxiety overall at 3 or 4 out of 10(10 being worst anxiety).  She filled out GAD 7 and scored 4 in total.  She filled out PHQ 9 and scored 7 in total (3 on concentration question).  She denies feeling depressed, denies anhedonia, reports eating and sleeping well.   She reports that her difficulties in concentration does not appear to relate with her anxiety.  She reports that her anxiety has significantly lessened however continues to struggle with concentration difficulties.   Associated Signs/Symptoms: Depression Symptoms:  Denies (Hypo) Manic Symptoms:  Denies Anxiety Symptoms:  Excessive Worry, Psychotic Symptoms:  Denies PTSD Symptoms: She denies any overt trauma however reports that semester at [employer] was difficult for her because of constantly getting sick and fear of getting sick.  She also reports that she had lost for family members and her dog over the span of 1 year.  She reports that last year she had 3 or 4 incidents where she  was walking on the [facility] [employer] but had flashbacks that she was at [employer].  Denies other PTSD symptoms.  Past Psychiatric History:   Inpatient: None RTC: None Outpatient:     - Meds: Zyrtec for allergies and birth control.  No previous psychiatric  meds.     - Therapy: Therapist in HS x 6 months for relationship issues. [facility] since last year for anxiety.  Hx of SI/HI: Denies   Previous Psychotropic Medications: No   Substance Abuse History in the last 12 months:  Yes.    Alcohol - couple of glasses on the weekend, wine.    Consequences of Substance Abuse: NA  Past Medical History:  Past Medical History:  Diagnosis Date  . Anxiety     Past Surgical History:  Procedure Laterality Date  . LAPAROSCOPIC APPENDECTOMY N/A 05/15/2018   Procedure: APPENDECTOMY LAPAROSCOPIC;  Surgeon: Sung AmabileSakai, Isami, DO;  Location: [facility] ORS;  Service: General;  Laterality: N/A;  . WISDOM TOOTH EXTRACTION      Family Psychiatric History:  Brother - Anxiety, ADHD but not sure   Family History: History reviewed. No pertinent family history.  Social History:   Social History   Socioeconomic History  . Marital status: Single    Spouse name: Not on file  . Number of children: 0  . Years of education: Not on file  . Highest education level: Some college, no degree  Occupational History  . Not on file  Social Needs  . Financial resource strain: Not hard at all  . Food insecurity:    Worry: Never true    Inability: Never true  . Transportation needs:    Medical: No    Non-medical: No  Tobacco Use  . Smoking status: Never Smoker  . Smokeless tobacco: Never Used  Substance and Sexual Activity  . Alcohol use: Yes    Alcohol/week: 1.0 - 2.0 standard drinks    Types: 1 - 2 Glasses of wine per week  . Drug use: Never  . Sexual activity: Not Currently  Lifestyle  . Physical activity:    Days per week: 5 days    Minutes per session: 40 min  . Stress: Not at all  Relationships  . Social connections:    Talks on phone: Not on file    Gets together: Not on file    Attends religious service: Never    Active member of club or organization: No    Attends meetings of clubs or organizations: Never    Relationship status: Never  married  Other Topics Concern  . Not on file  Social History Narrative  . Not on file    Additional Social History: Patient is originally from [place], currently a sophomore at [employer].  She has her both the parents and rest of the family in [place].  She has an older brother who has graduated from [employer] and currently is in [place].  She lives in [employer] [place], does not have any roommate.  She reports that she likes running and exercises.  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  She reports that since moving her measures she feels much better.  Allergies:  No Known Allergies  Metabolic Disorder Labs: No results found for: HGBA1C, MPG No results found for: PROLACTIN No results found for: CHOL, TRIG, HDL, CHOLHDL, VLDL, LDLCALC No results found for: TSH  Therapeutic Level Labs: No results found for: LITHIUM No results found for: CBMZ No results found for: VALPROATE  Current Medications: Current Outpatient Medications  Medication Sig Dispense Refill  . BLISOVI 24 FE 1-20 MG-MCG(24) tablet Take 1 tablet by mouth daily.  3  . cetirizine (ZYRTEC) 10 MG tablet Take 10 mg by mouth daily.  3   No current facility-administered medications for this visit.     Musculoskeletal:  Gait & Station: normal Patient leans: N/A  Psychiatric Specialty Exam: Review of Systems  Constitutional: Negative for fever.  HENT: Negative.   Eyes: Negative.   Respiratory: Negative.   Cardiovascular: Negative.   Gastrointestinal: Negative.   Genitourinary: Negative.   Skin: Negative.   Neurological: Negative.   Endo/Heme/Allergies: Negative.   Psychiatric/Behavioral: Negative for depression, hallucinations, substance abuse and suicidal ideas. The patient is nervous/anxious.     Blood pressure 109/71, pulse (!) 56, temperature 98.7 F (37.1 C),  temperature source Oral, weight 169 lb 3.2 oz (76.7 kg), last menstrual period 08/26/2018.Body mass index is 28.16 kg/m.  General Appearance: Casual and Well Groomed  Eye Contact:  Good  Speech:  Clear and Coherent and Normal Rate  Volume:  Normal  Mood:  "Good"  Affect:  Appropriate, Congruent and Full Range  Thought Process:  Goal Directed and Linear  Orientation:  Full (Time, Place, and Person)  Thought Content:  Logical  Suicidal Thoughts:  No  Homicidal Thoughts:  No  Memory:  Immediate;   Good Recent;   Good Remote;   Good  Judgement:  Good  Insight:  Good  Psychomotor Activity:  Normal  Concentration:  Concentration: Good and Attention Span: Good  Recall:  Good  Fund of Knowledge:Good  Language: Good  Akathisia:  No    AIMS (if indicated):  NA  Assets:  Communication Skills Desire for Improvement Financial Resources/Insurance Housing Leisure Time Physical Health Social Support Talents/Skills Transportation Vocational/Educational  ADL's:  Intact  Cognition: WNL  Sleep:  Good   Screenings:  GAD 7 = 4 PHQ 9 = 7  Assessment and Plan:   -23 year old female with history of anxiety referred by patient's therapist for psychiatric evaluations for concerns for ADHD and anxiety. -Patient appears to have some genetic predisposition to ADHD and anxiety. -Her anxiety seems to have precipitated while she was at the [employer] [place] due to health issues related to her living environment and her roommate's death because of health issues. -Her anxiety seems to have improved with therapy and moving to [employer] [place], continues to endorse intermittent anxiety, which she seems to be managing better with her coping skills and does not seem to impact her functioning. -She endorses symptoms of ADHD, however it would be unusual to in March ADHD symptoms late in the high school.  Difficulties with concentration also worsened with psychosocial  stressors as described above at [facility] [place] which could be due to in the context of anxiety.  At the same time patient reports improvement in anxiety however does not see improvement with concentration difficulties.  Therefore it is difficult to conclusively diagnose her with ADHD based on the current psychiatric evaluation. -She does not appear depressed.  Plan:  #1 - ADHD - Recommend Psychological testing for ADHD and referred to [place] attention specialist for testing. -Recommended to follow-up after the testing with this writer to discuss the testing results and medication options if she is diagnosed with ADHD.  #2 Anxiety -Recommended to continue [facility] with [facility]. -  Will not recommend medication management at this point since patient seems to have improvement with anxiety with therapy and anxiety does not seem to impact her functioning.  Pt was seen for 60 minutes for face to face and greater than 50% of time was spent discussion assessment and plan, providing psychoeducation, and recommendation for psychological testing for ADHD.   This chart was dictated using voice recognition software/Dragon. Despite best efforts to proofread, errors can occur which can change the meaning. Any change was purely unintentional.    Darcel Smalling, MD 2/11/20205:57 PM

## 2018-08-28 NOTE — Progress Notes (Signed)
Angela Campbell is a 23 y.o. female in treatment for ADHD, Anxiety and displays the following risk factors for Suicide:  Demographic factors:  Adolescent or young adult, Caucasian and Living alone Current Mental Status: Denies any SI/HI Loss Factors: Loss of significant relationship Historical Factors: Family history of mental illness or substance abuse Risk Reduction Factors: Sense of responsibility to family, Employed, Living with another person, especially a relative, Positive social support, Positive therapeutic relationship and Positive coping skills or problem solving skills  CLINICAL FACTORS:  Anxiety  COGNITIVE FEATURES THAT CONTRIBUTE TO RISK: None    SUICIDE RISK:  Minimal: No identifiable suicidal ideation.  Patients presenting with no risk factors; may be classified as minimal risk based on the severity of the depressive symptoms  Mental Status: As mentioned in H&P from today's visit.    PLAN OF CARE: As mentioned in H&P from today's visit.     Darcel Smalling, MD 08/28/2018, 5:55 PM

## 2018-09-12 ENCOUNTER — Telehealth: Payer: Self-pay

## 2018-09-12 NOTE — Telephone Encounter (Signed)
Dr.Umrania's patient 

## 2018-09-12 NOTE — Telephone Encounter (Signed)
patient has appt for 09-21-18 @ 1:00 with Dr. Elisabeth Most

## 2018-09-12 NOTE — Telephone Encounter (Signed)
Thanks

## 2019-04-15 ENCOUNTER — Other Ambulatory Visit: Payer: Self-pay

## 2019-04-15 DIAGNOSIS — Z20822 Contact with and (suspected) exposure to covid-19: Secondary | ICD-10-CM

## 2019-04-16 LAB — NOVEL CORONAVIRUS, NAA: SARS-CoV-2, NAA: NOT DETECTED

## 2019-05-09 ENCOUNTER — Other Ambulatory Visit: Payer: Self-pay

## 2019-05-09 DIAGNOSIS — Z20822 Contact with and (suspected) exposure to covid-19: Secondary | ICD-10-CM

## 2019-05-11 LAB — NOVEL CORONAVIRUS, NAA: SARS-CoV-2, NAA: NOT DETECTED

## 2019-06-04 ENCOUNTER — Telehealth: Payer: Self-pay

## 2019-06-04 NOTE — Telephone Encounter (Signed)
Dr.Umrania's patient 

## 2019-06-04 NOTE — Telephone Encounter (Signed)
received adhd testing report from [place] attention specialist.

## 2019-06-05 NOTE — Telephone Encounter (Signed)
Thanks, received

## 2019-08-07 ENCOUNTER — Emergency Department
Admission: EM | Admit: 2019-08-07 | Discharge: 2019-08-07 | Disposition: A | Discharge status: Home / Self Care | Payer: BC Managed Care – PPO | Attending: Emergency Medicine | Admitting: Emergency Medicine

## 2019-08-07 ENCOUNTER — Other Ambulatory Visit: Payer: Self-pay

## 2019-08-07 ENCOUNTER — Encounter: Payer: Self-pay | Admitting: Emergency Medicine

## 2019-08-07 DIAGNOSIS — R1030 Lower abdominal pain, unspecified: Secondary | ICD-10-CM | POA: Diagnosis not present

## 2019-08-07 DIAGNOSIS — R197 Diarrhea, unspecified: Secondary | ICD-10-CM | POA: Insufficient documentation

## 2019-08-07 DIAGNOSIS — R110 Nausea: Secondary | ICD-10-CM | POA: Insufficient documentation

## 2019-08-07 DIAGNOSIS — Z5321 Procedure and treatment not carried out due to patient leaving prior to being seen by health care provider: Secondary | ICD-10-CM | POA: Insufficient documentation

## 2019-08-07 LAB — COMPREHENSIVE METABOLIC PANEL
ALT: 19 U/L (ref 0–44)
AST: 21 U/L (ref 15–41)
Albumin: 4.4 g/dL (ref 3.5–5.0)
Alkaline Phosphatase: 49 U/L (ref 38–126)
Anion gap: 11 (ref 5–15)
BUN: 15 mg/dL (ref 6–20)
CO2: 25 mmol/L (ref 22–32)
Calcium: 9.3 mg/dL (ref 8.9–10.3)
Chloride: 102 mmol/L (ref 98–111)
Creatinine, Ser: 0.76 mg/dL (ref 0.44–1.00)
GFR calc Af Amer: >60 mL/min (ref >60)
GFR calc non Af Amer: >60 mL/min (ref >60)
Glucose, Bld: 106 mg/dL — ABNORMAL HIGH (ref 70–99)
Potassium: 3.3 mmol/L — ABNORMAL LOW (ref 3.5–5.1)
Sodium: 138 mmol/L (ref 135–145)
Total Bilirubin: 0.9 mg/dL (ref 0.3–1.2)
Total Protein: 7.8 g/dL (ref 6.5–8.1)

## 2019-08-07 LAB — CBC WITH DIFFERENTIAL/PLATELET
Abs Immature Granulocytes: 0.02 10*3/uL (ref 0.00–0.07)
Basophils Absolute: 0 10*3/uL (ref 0.0–0.1)
Basophils Relative: 0 %
Eosinophils Absolute: 0.1 10*3/uL (ref 0.0–0.5)
Eosinophils Relative: 1 %
HCT: 42.5 % (ref 36.0–46.0)
Hemoglobin: 14.5 g/dL (ref 12.0–15.0)
Immature Granulocytes: 0 %
Lymphocytes Relative: 21 %
Lymphs Abs: 2.2 10*3/uL (ref 0.7–4.0)
MCH: 29.8 pg (ref 26.0–34.0)
MCHC: 34.1 g/dL (ref 30.0–36.0)
MCV: 87.4 fL (ref 80.0–100.0)
Monocytes Absolute: 0.8 10*3/uL (ref 0.1–1.0)
Monocytes Relative: 7 %
Neutro Abs: 7.5 10*3/uL (ref 1.7–7.7)
Neutrophils Relative %: 71 %
Platelets: 413 10*3/uL — ABNORMAL HIGH (ref 150–400)
RBC: 4.86 MIL/uL (ref 3.87–5.11)
RDW: 11.4 % — ABNORMAL LOW (ref 11.5–15.5)
WBC: 10.6 10*3/uL — ABNORMAL HIGH (ref 4.0–10.5)
nRBC: 0 % (ref 0.0–0.2)

## 2019-08-07 LAB — URINALYSIS, COMPLETE (UACMP) WITH MICROSCOPIC
Bilirubin Urine: NEGATIVE
Glucose, UA: NEGATIVE mg/dL
Ketones, ur: NEGATIVE mg/dL
Nitrite: NEGATIVE
Protein, ur: NEGATIVE mg/dL
Specific Gravity, Urine: 1.001 — ABNORMAL LOW (ref 1.005–1.030)
Squamous Epithelial / HPF: NONE SEEN (ref 0–5)
pH: 7 (ref 5.0–8.0)

## 2019-08-07 LAB — POCT PREGNANCY, URINE: Preg Test, Ur: NEGATIVE

## 2019-08-07 LAB — LIPASE, BLOOD: Lipase: 28 U/L (ref 11–51)

## 2019-08-07 NOTE — ED Triage Notes (Signed)
Patient ambulatory to triage with steady gait, without difficulty or distress noted, mask in place; pt reports 8 diarrheal stools in 24hrs with lower abd pain and nausea

## 2019-08-08 ENCOUNTER — Telehealth: Payer: Self-pay | Admitting: Emergency Medicine

## 2019-08-08 NOTE — Telephone Encounter (Signed)
Called patient due to lwot to inquire about condition and follow up plans. Left message.   

## 2019-08-25 ENCOUNTER — Ambulatory Visit (INDEPENDENT_AMBULATORY_CARE_PROVIDER_SITE_OTHER)
Admission: RE | Admit: 2019-08-25 | Discharge: 2019-08-25 | Disposition: A | Discharge status: Home / Self Care | Payer: BC Managed Care – PPO | Source: Ambulatory Visit

## 2019-08-25 DIAGNOSIS — R197 Diarrhea, unspecified: Secondary | ICD-10-CM | POA: Diagnosis not present

## 2019-08-25 NOTE — Discharge Instructions (Addendum)
Get rest and push fluids You may supplement with OTC pedialtye and/or oral rehydration solution Follow up in person for stool studies if symptoms return If you experience new or worsening symptoms go to ER such as fever, chills, nausea, vomiting, diarrhea, bloody or dark tarry stools, constipation, urinary symptoms, abdominal discomfort, inability to keep fluids down, etc..Marland Kitchen

## 2019-08-25 NOTE — ED Provider Notes (Signed)
[place]    Virtual Visit via Video Note:  [name]  initiated request for Telemedicine visit with [facility] team. I connected with [name]  on 08/25/2019 at 11:54 AM  for a synchronized telemedicine visit using a video enabled HIPPA compliant telemedicine application. I verified that I am speaking with [name]  using two identifiers. [name], PA-C  was physically located in a [facility] Urgent care site and [name] was located at a different location.   The limitations of evaluation and management by telemedicine as well as the availability of in-person appointments were discussed. Patient was informed that she  may incur a bill ( including co-pay) for this virtual visit encounter. [name]  expressed understanding and gave verbal consent to proceed with virtual visit.   [id number] 08/25/19 Arrival Time: 1129  CC: ABDOMINAL DISCOMFORT  SUBJECTIVE:  Angela Campbell is a 24 y.o. female hx of IBS, who presents with complaint of persistent watery diarrhea x 1.5 weeks.  Symptoms began after taking weight-loss supplement, cambogia garcinia. Otherwise, denies diet changes, antibiotic use, traveling, or close contacts with similar symptoms. Initially had some abdominal discomfort, but took bentyl with relief.  Denies abdominal discomfort currently.  Has tried bland foods, omeprazole, pepto, and increasing fluids without relief.  However, took imodium last night, and has not had watery diarrhea today.  Symptoms made worse with eating.  Denies similar symptoms in the past.  Complains of black stools with pepto, now resolved.  Complains of decreased appetite, nausea, and 1 episode of vomiting.  Denies fever, chills, weight changes,vomiting, chest pain, SOB, constipation, hematochezia, dysuria, difficulty urinating, increased frequency or urgency, flank pain, loss of bowel or bladder function, vaginal discharge, vaginal odor, vaginal bleeding,  dyspareunia, pelvic pain.     LMP: 08/19/19 Currently on BC OCP  ROS: As per HPI.  All other pertinent ROS negative.     Past Medical History:  Diagnosis Date  . Anxiety    Past Surgical History:  Procedure Laterality Date  . LAPAROSCOPIC APPENDECTOMY N/A 05/15/2018   Procedure: APPENDECTOMY LAPAROSCOPIC;  Surgeon: Benjamine Sprague, DO;  Location: [facility] ORS;  Service: General;  Laterality: N/A;  . WISDOM TOOTH EXTRACTION     No Known Allergies No current facility-administered medications on file prior to encounter.   Current Outpatient Medications on File Prior to Encounter  Medication Sig Dispense Refill  . BLISOVI 24 FE 1-20 MG-MCG(24) tablet Take 1 tablet by mouth daily.  3  . cetirizine (ZYRTEC) 10 MG tablet Take 10 mg by mouth daily.  3     OBJECTIVE:  There were no vitals filed for this visit.  General appearance: alert; no distress Eyes: EOMI grossly HENT: normocephalic; atraumatic Neck: supple with FROM Lungs: normal respiratory effort; speaking in full sentences without difficulty Extremities: moves extremities without difficulty Skin: No obvious rashes Neurologic: No facial asymmetries Psychological: alert and cooperative; normal mood and affect   ASSESSMENT & PLAN:  1. Diarrhea, unspecified type     Get rest and push fluids You may supplement with OTC pedialtye and/or oral rehydration solution Follow up in person for stool studies if symptoms return If you experience new or worsening symptoms go to ER such as fever, chills, nausea, vomiting, diarrhea, bloody or dark tarry stools, constipation, urinary symptoms, abdominal discomfort, inability to keep fluids down, etc...  I discussed the assessment and treatment plan with the patient. The patient was provided an opportunity to ask questions and all were answered. The  patient agreed with the plan and demonstrated an understanding of the instructions.   The patient was advised to call back or seek an in-person  evaluation if the symptoms worsen or if the condition fails to improve as anticipated.  I provided 20 minutes of non-face-to-face time during this encounter.  Rennis Harding, PA-C  08/25/2019 11:54 AM    Rennis Harding, PA-C 08/25/19 1154

## 2019-09-11 ENCOUNTER — Encounter: Payer: Self-pay | Admitting: *Deleted

## 2019-09-16 ENCOUNTER — Other Ambulatory Visit: Payer: Self-pay

## 2019-09-19 ENCOUNTER — Telehealth: Payer: Self-pay | Admitting: Gastroenterology

## 2019-09-19 ENCOUNTER — Encounter: Payer: Self-pay | Admitting: Gastroenterology

## 2019-09-19 ENCOUNTER — Ambulatory Visit (INDEPENDENT_AMBULATORY_CARE_PROVIDER_SITE_OTHER): Payer: BC Managed Care – PPO | Admitting: Gastroenterology

## 2019-09-19 DIAGNOSIS — K219 Gastro-esophageal reflux disease without esophagitis: Secondary | ICD-10-CM | POA: Diagnosis not present

## 2019-09-19 DIAGNOSIS — R197 Diarrhea, unspecified: Secondary | ICD-10-CM | POA: Diagnosis not present

## 2019-09-19 NOTE — Progress Notes (Signed)
Angela Campbell [address]  [place]  [place], [place] [address]  Main: [phone]  Fax: [phone]   Gastroenterology Consultation  Referring Provider:     Claiborne Billings, [place]* Primary Care Physician:  System, Pcp Not In Reason for Consultation: Diarrhea        HPI:   Virtual Visit via Video Note  I connected with patient on 09/19/19 at 11:00 AM EST by video ([email or web address]) and verified that I am speaking with the correct person using two identifiers.   I discussed the limitations, risks, security and privacy concerns of performing an evaluation and management service by video and the availability of in person appointments. I also discussed with the patient that there may be a patient responsible charge related to this service. The patient expressed understanding and agreed to proceed.  Location of the patient: Home Location of provider: Home Participating persons: Patient and provider only (Nursing staff checked in patient via phone but were not physically involved in the video interaction - see their notes)   History of Present Illness: Chief Complaint  Patient presents with  . New Patient (Initial Visit)  . Diarrhea    Patient states this has improved   . Gastroesophageal Reflux    Has some heartburn yesterday     Angela Campbell is a 24 y.o. y/o female referred for consultation & management  by Dr. Estanislado Spire, Pcp Not In.  Patient reports diarrhea started in January of this year, which is about 2 months ago.  At most has had  8 bowel movements a day when it first started.  No blood in stool.  Attributes some of her symptoms to starting a supplement called cambogia garcinia.  Since the symptoms began she stopped taking the supplement.  She had also started omeprazole a little before her symptoms started.  This was for reflux.  Was having burning sensation in her chest and has been taking omeprazole since January until 5 days ago when she ran out of the  medication.  Was also having some abdominal discomfort with the diarrhea which is better as well.  Is now having to formed bowel movements a day.  No weight loss.  No nausea or vomiting.  Reflux has resolved as well.  No dysphagia.  Prior to January, was having normal bowel movements and no abdominal pain.  Reports 4 years ago when she was in high school, had constipation issues and was using MiraLAX at that time, but has not done so in about 4 years.  No family history of colon cancer.  No family history of IBD.  Past Medical History:  Diagnosis Date  . Anxiety     Past Surgical History:  Procedure Laterality Date  . LAPAROSCOPIC APPENDECTOMY N/A 05/15/2018   Procedure: APPENDECTOMY LAPAROSCOPIC;  Surgeon: Benjamine Sprague, DO;  Location: [facility] ORS;  Service: General;  Laterality: N/A;  . WISDOM TOOTH EXTRACTION      Prior to Admission medications   Medication Sig Start Date End Date Taking? Authorizing Provider  Azelastine HCl 0.15 % SOLN  08/21/19  Yes [provider]  BLISOVI 24 FE 1-20 MG-MCG(24) tablet Take 1 tablet by mouth daily. 05/04/18  Yes [provider]  cetirizine (ZYRTEC) 10 MG tablet Take 10 mg by mouth daily. 04/18/18  Yes [provider]  fluticasone Asencion Islam) 50 MCG/ACT nasal spray SMARTSIG:2 Puff(s) Both Nares Daily 07/29/19  Yes [provider]    History reviewed. No pertinent family history.   Social History  Tobacco Use  . Smoking status: Never Smoker  . Smokeless tobacco: Never Used  Substance Use Topics  . Alcohol use: Yes    Alcohol/week: 1.0 - 2.0 standard drinks    Types: 1 - 2 Glasses of wine per week    Comment: occ  . Drug use: Never    Allergies as of 09/19/2019  . (No Known Allergies)    Review of Systems:    All systems reviewed and negative except where noted in HPI.   Observations/Objective:  Labs: CBC    Component Value Date/Time   WBC 10.6 (H) 08/07/2019 2041   RBC 4.86 08/07/2019 2041   HGB  14.5 08/07/2019 2041   HCT 42.5 08/07/2019 2041   PLT 413 (H) 08/07/2019 2041   MCV 87.4 08/07/2019 2041   MCH 29.8 08/07/2019 2041   MCHC 34.1 08/07/2019 2041   RDW 11.4 (L) 08/07/2019 2041   LYMPHSABS 2.2 08/07/2019 2041   MONOABS 0.8 08/07/2019 2041   EOSABS 0.1 08/07/2019 2041   BASOSABS 0.0 08/07/2019 2041   CMP     Component Value Date/Time   NA 138 08/07/2019 2041   K 3.3 (L) 08/07/2019 2041   CL 102 08/07/2019 2041   CO2 25 08/07/2019 2041   GLUCOSE 106 (H) 08/07/2019 2041   BUN 15 08/07/2019 2041   CREATININE 0.76 08/07/2019 2041   CALCIUM 9.3 08/07/2019 2041   PROT 7.8 08/07/2019 2041   ALBUMIN 4.4 08/07/2019 2041   AST 21 08/07/2019 2041   ALT 19 08/07/2019 2041   ALKPHOS 49 08/07/2019 2041   BILITOT 0.9 08/07/2019 2041   GFRNONAA >60 08/07/2019 2041   GFRAA >60 08/07/2019 2041    Imaging Studies: No results found.  Assessment and Plan:   Angela Campbell is a 24 y.o. y/o female has been referred for diarrhea  Assessment and Plan: Onset of diarrhea occurred when patient started a supplement called cambogia garcinia, and also after starting omeprazole  Both abdominal discomfort and diarrhea are now significantly better.  Is now having 2 formed bowel movements a day.  Abdominal discomfort has significantly improved with improvement in diarrhea.  Stool cultures ordered by PCP were negative.  Her symptoms were likely due to the supplement as nausea vomiting and diarrhea are one of the side effects listed.  She was advised to stay away from this and other over-the-counter and herbal supplements until she discusses these with her primary care provider.  She is off the omeprazole for 5 days and reflux is still well controlled.  She was advised to continue to stay off of these and this can cause diarrhea as well.  If reflux symptoms recur she was advised to call us and we can consider Pepcid at that time  Patient educated extensively on acid reflux lifestyle  modification, including buying a bed wedge, not eating 3 hrs before bedtime, diet modifications, and handout given for the same.   If abdominal discomfort reoccurs, can order H. pylori testing.  For now it has improved to completely resolved  If diarrhea reoccurs, can order fecal calprotectin.  Patient does not have any blood in stool, or chronic symptoms to suggest IBD at this time.  Follow Up Instructions:   I discussed the assessment and treatment plan with the patient. The patient was provided an opportunity to ask questions and all were answered. The patient agreed with the plan and demonstrated an understanding of the instructions.   The patient was advised to call back or seek an in-person  evaluation if the symptoms worsen or if the condition fails to improve as anticipated.  I provided 15 minutes of face-to-face time via video software during this encounter.  Additional time was spent in reviewing patient's chart, placing orders etc.   Pasty Spillers, MD  Speech recognition software was used to dictate the above note.

## 2019-09-19 NOTE — Telephone Encounter (Signed)
Left vm to offer 6-8 week f/u

## 2019-09-19 NOTE — Patient Instructions (Signed)
Please get bed wedge off line  Gastroesophageal Reflux Disease, Adult Gastroesophageal reflux (GER) happens when acid from the stomach flows up into the tube that connects the mouth and the stomach (esophagus). Normally, food travels down the esophagus and stays in the stomach to be digested. With GER, food and stomach acid sometimes move back up into the esophagus. You may have a disease called gastroesophageal reflux disease (GERD) if the reflux:  Happens often.  Causes frequent or very bad symptoms.  Causes problems such as damage to the esophagus. When this happens, the esophagus becomes sore and swollen (inflamed). Over time, GERD can make small holes (ulcers) in the lining of the esophagus. What are the causes? This condition is caused by a problem with the muscle between the esophagus and the stomach. When this muscle is weak or not normal, it does not close properly to keep food and acid from coming back up from the stomach. The muscle can be weak because of:  Tobacco use.  Pregnancy.  Having a certain type of hernia (hiatal hernia).  Alcohol use.  Certain foods and drinks, such as coffee, chocolate, onions, and peppermint. What increases the risk? You are more likely to develop this condition if you:  Are overweight.  Have a disease that affects your connective tissue.  Use NSAID medicines. What are the signs or symptoms? Symptoms of this condition include:  Heartburn.  Difficult or painful swallowing.  The feeling of having a lump in the throat.  A bitter taste in the mouth.  Bad breath.  Having a lot of saliva.  Having an upset or bloated stomach.  Belching.  Chest pain. Different conditions can cause chest pain. Make sure you see your doctor if you have chest pain.  Shortness of breath or noisy breathing (wheezing).  Ongoing (chronic) cough or a cough at night.  Wearing away of the surface of teeth (tooth enamel).  Weight loss. How is this  treated? Treatment will depend on how bad your symptoms are. Your doctor may suggest:  Changes to your diet.  Medicine.  Surgery. Follow these instructions at home: Eating and drinking   Follow a diet as told by your doctor. You may need to avoid foods and drinks such as: ? Coffee and tea (with or without caffeine). ? Drinks that contain alcohol. ? Energy drinks and sports drinks. ? Bubbly (carbonated) drinks or sodas. ? Chocolate and cocoa. ? Peppermint and mint flavorings. ? Garlic and onions. ? Horseradish. ? Spicy and acidic foods. These include peppers, chili powder, curry powder, vinegar, hot sauces, and BBQ sauce. ? Citrus fruit juices and citrus fruits, such as oranges, lemons, and limes. ? Tomato-based foods. These include red sauce, chili, salsa, and pizza with red sauce. ? Fried and fatty foods. These include donuts, french fries, potato chips, and high-fat dressings. ? High-fat meats. These include hot dogs, rib eye steak, sausage, ham, and bacon. ? High-fat dairy items, such as whole milk, butter, and cream cheese.  Eat small meals often. Avoid eating large meals.  Avoid drinking large amounts of liquid with your meals.  Avoid eating meals during the 2-3 hours before bedtime.  Avoid lying down right after you eat.  Do not exercise right after you eat. Lifestyle   Do not use any products that contain nicotine or tobacco. These include cigarettes, e-cigarettes, and chewing tobacco. If you need help quitting, ask your doctor.  Try to lower your stress. If you need help doing this, ask your doctor.  If you are overweight, lose an amount of weight that is healthy for you. Ask your doctor about a safe weight loss goal. General instructions  Pay attention to any changes in your symptoms.  Take over-the-counter and prescription medicines only as told by your doctor. Do not take aspirin, ibuprofen, or other NSAIDs unless your doctor says it is okay.  Wear loose  clothes. Do not wear anything tight around your waist.  Raise (elevate) the head of your bed about 6 inches (15 cm).  Avoid bending over if this makes your symptoms worse.  Keep all follow-up visits as told by your doctor. This is important. Contact a doctor if:  You have new symptoms.  You lose weight and you do not know why.  You have trouble swallowing or it hurts to swallow.  You have wheezing or a cough that keeps happening.  Your symptoms do not get better with treatment.  You have a hoarse voice. Get help right away if:  You have pain in your arms, neck, jaw, teeth, or back.  You feel sweaty, dizzy, or light-headed.  You have chest pain or shortness of breath.  You throw up (vomit) and your throw-up looks like blood or coffee grounds.  You pass out (faint).  Your poop (stool) is bloody or black.  You cannot swallow, drink, or eat. Summary  If a person has gastroesophageal reflux disease (GERD), food and stomach acid move back up into the esophagus and cause symptoms or problems such as damage to the esophagus.  Treatment will depend on how bad your symptoms are.  Follow a diet as told by your doctor.  Take all medicines only as told by your doctor. This information is not intended to replace advice given to you by your health care provider. Make sure you discuss any questions you have with your health care provider. Document Revised: 01/10/2018 Document Reviewed: 01/10/2018 Elsevier Patient Education  2020 [employer].

## 2019-12-14 IMAGING — CT CT ABD-PELV W/ CM
2 of 4 series · 16 of 46 positions shown, 18 images · IV contrast (APPLIED)
Comparison: None.

CLINICAL DATA: Right lower quadrant abdomen pain for 2 days.

EXAM:
CT ABDOMEN AND PELVIS WITH CONTRAST
TECHNIQUE: Multidetector CT imaging of the abdomen and pelvis was performed
using the standard protocol following bolus administration of
intravenous contrast.
CONTRAST:  100mL [id number] IOPAMIDOL ([id number]) INJECTION 61%

[Series 2: routine abd/pel with · axial · 0.73mm/px · z∈[-390,+16]mm · 13 of 89 slices shown, 15 images]
[im 4/89  soft-tissue]
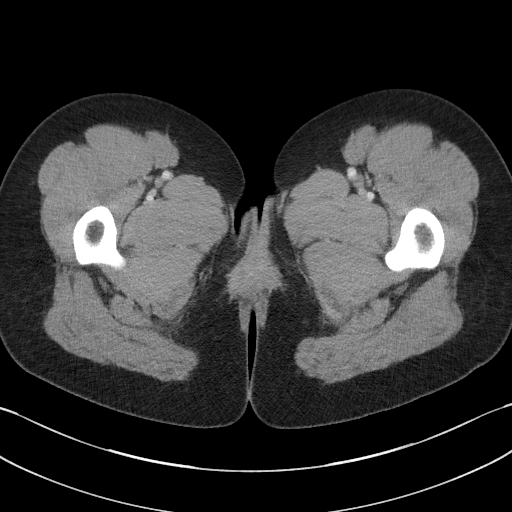
[im 4/89  bone]
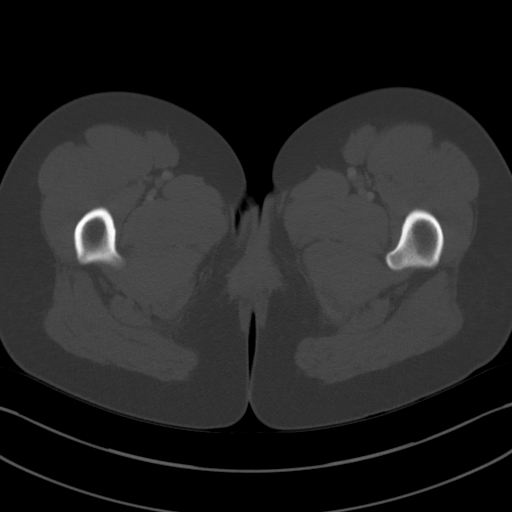
[im 12/89  soft-tissue]
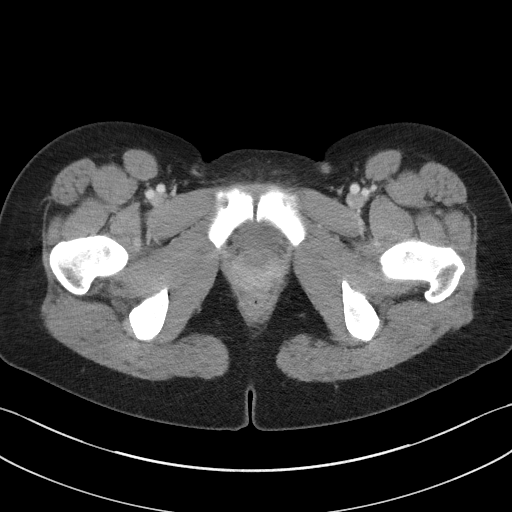
[im 19/89  soft-tissue]
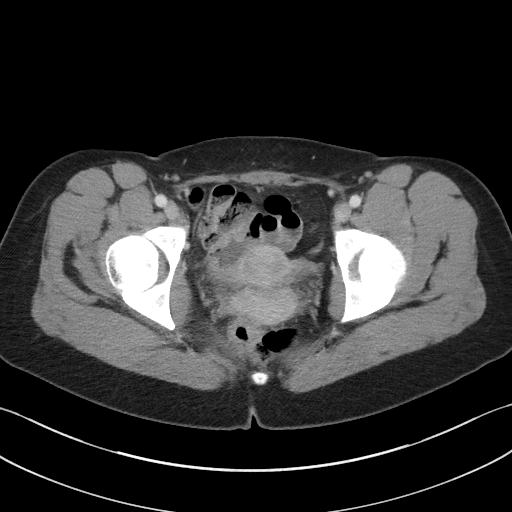
[im 26/89  soft-tissue]
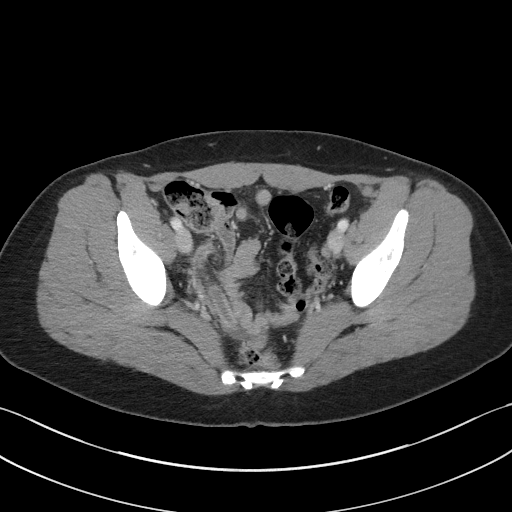
[im 30/89  soft-tissue]
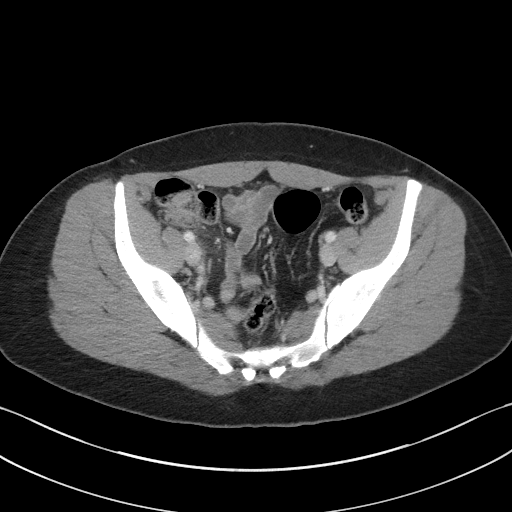
[im 37/89  soft-tissue]
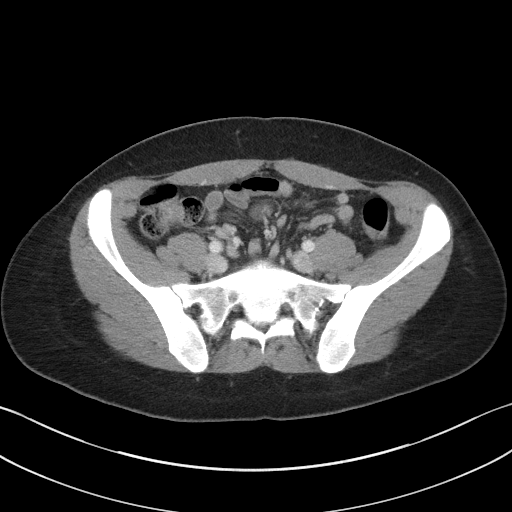
[im 45/89  soft-tissue]
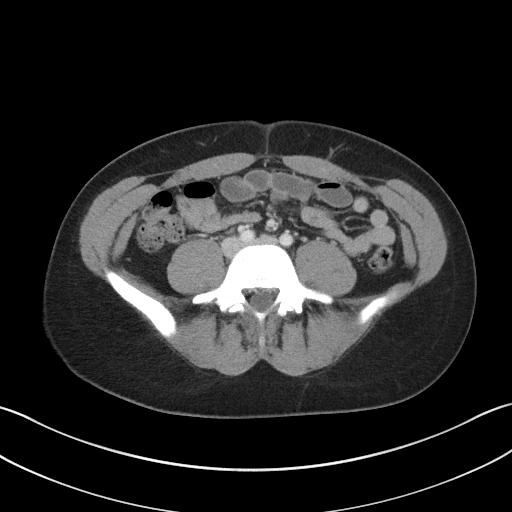
[im 52/89  soft-tissue]
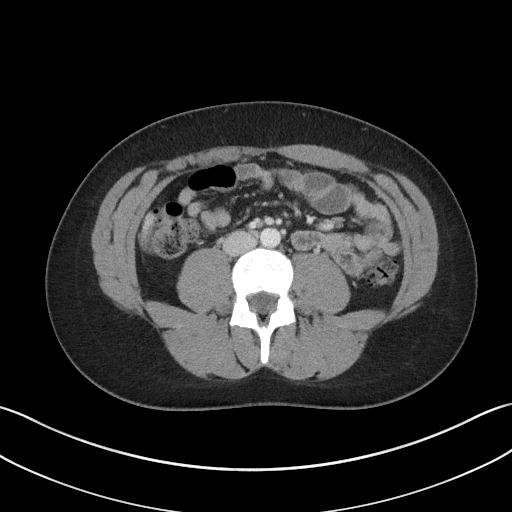
[im 59/89  soft-tissue]
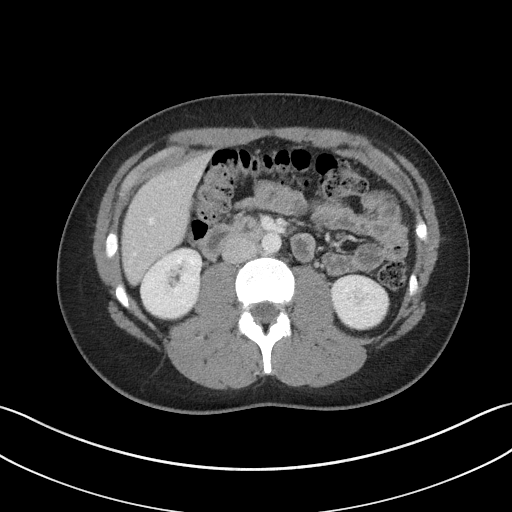
[im 59/89  bone]
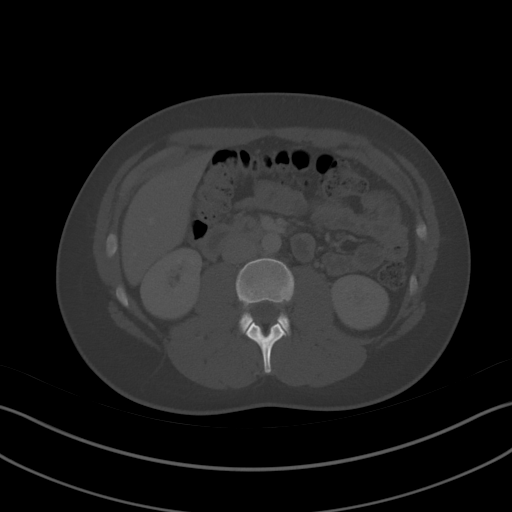
[im 63/89  soft-tissue]
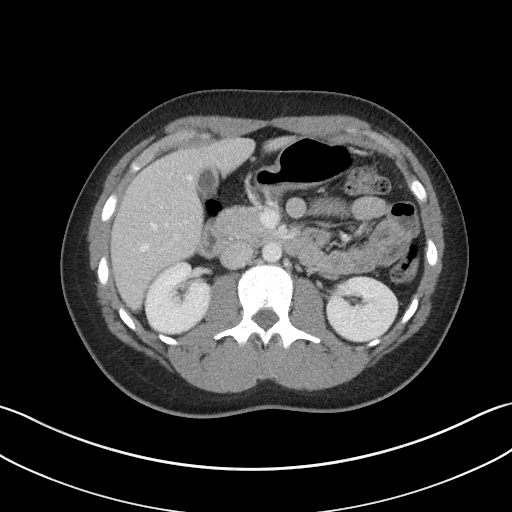
[im 70/89  soft-tissue]
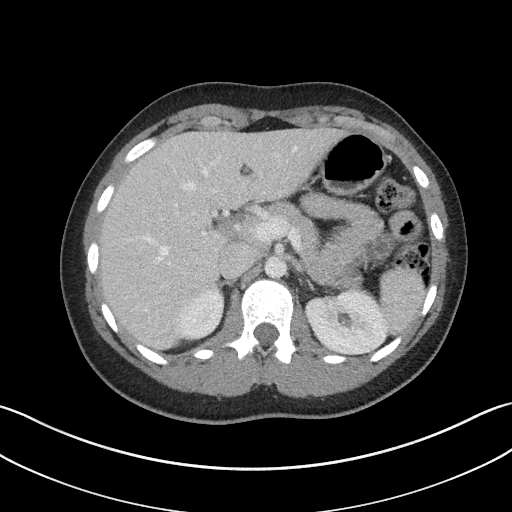
[im 78/89  soft-tissue]
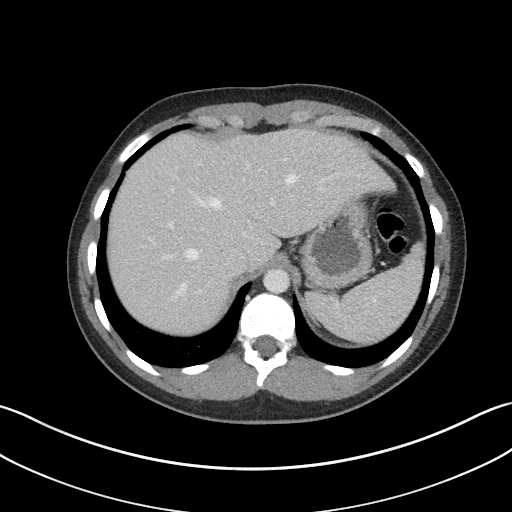
[im 85/89  soft-tissue]
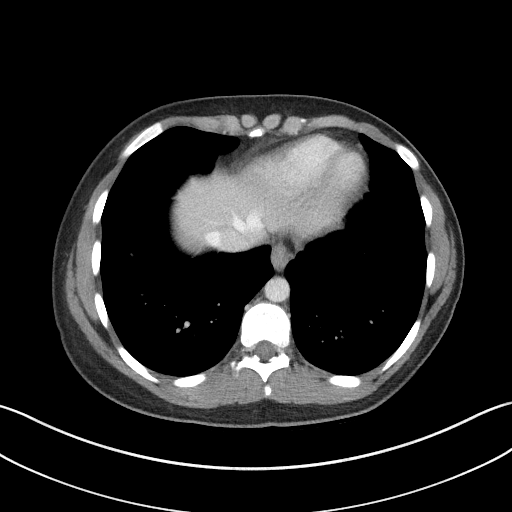

[Series 5: coronal st · coronal · 0.79mm/px · 3 of 82 slices shown]
[im 28/82  soft-tissue]
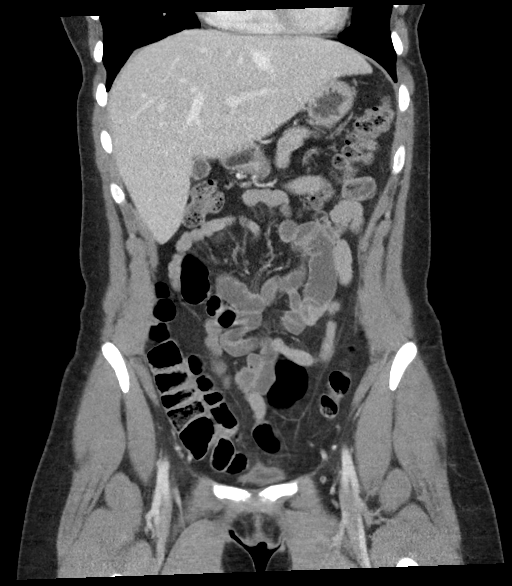
[im 37/82  soft-tissue]
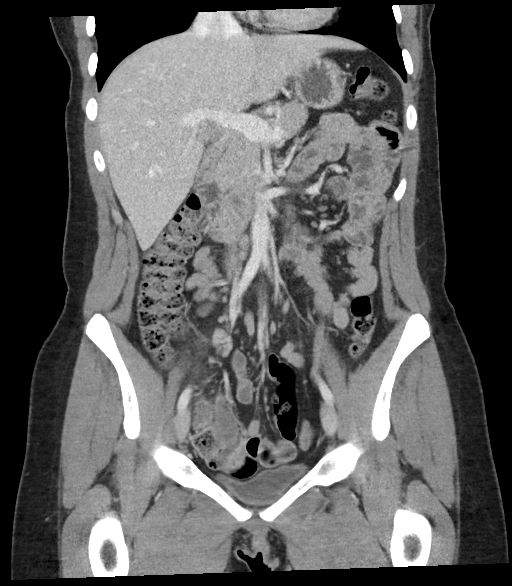
[im 46/82  soft-tissue]
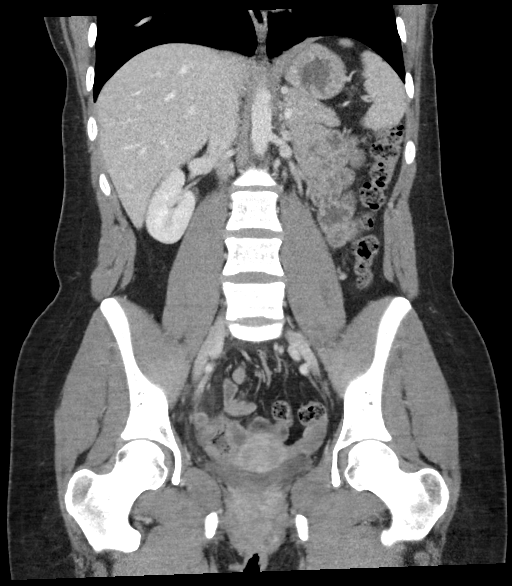

[16 of 46 positions shown; findings below may reference images not displayed]

FINDINGS: Lower chest: No acute abnormality.

Hepatobiliary: No focal liver abnormality is seen. No gallstones,
gallbladder wall thickening, or biliary dilatation.

Pancreas: Unremarkable. No pancreatic ductal dilatation or
surrounding inflammatory changes.

Spleen: Normal in size without focal abnormality.

Adrenals/Urinary Tract: Adrenal glands are unremarkable. Kidneys are
normal, without renal calculi, focal lesion, or hydronephrosis.
Bladder is unremarkable.

Stomach/Bowel: The appendix is enlarged with enhancing wall and
surrounding inflammation consistent with appendicitis. Small amount
of free fluid is identified in the pelvis. There is no small bowel
obstruction. The colon is normal. The stomach is normal.

Vascular/Lymphatic: No significant vascular findings are present. No
enlarged abdominal or pelvic lymph nodes.

Reproductive: Uterus and bilateral adnexa are unremarkable.

Other: None.

Musculoskeletal: No acute or significant osseous findings.
IMPRESSION: Acute appendicitis.

These results will be called to the ordering clinician or
representative by the Radiologist Assistant, and communication
documented in the PACS or zVision Dashboard.

## 2020-05-13 ENCOUNTER — Ambulatory Visit
Admission: EM | Admit: 2020-05-13 | Discharge: 2020-05-13 | Disposition: A | Discharge status: Home / Self Care | Payer: BC Managed Care – PPO | Attending: Emergency Medicine | Admitting: Emergency Medicine

## 2020-05-13 ENCOUNTER — Other Ambulatory Visit: Payer: Self-pay

## 2020-05-13 DIAGNOSIS — W57XXXA Bitten or stung by nonvenomous insect and other nonvenomous arthropods, initial encounter: Secondary | ICD-10-CM | POA: Diagnosis not present

## 2020-05-13 DIAGNOSIS — L03113 Cellulitis of right upper limb: Secondary | ICD-10-CM | POA: Diagnosis not present

## 2020-05-13 DIAGNOSIS — S50861A Insect bite (nonvenomous) of right forearm, initial encounter: Secondary | ICD-10-CM | POA: Diagnosis not present

## 2020-05-13 MED ORDER — CEPHALEXIN 500 MG PO CAPS: 500.0000 mg | ORAL_CAPSULE | Freq: Four times a day (QID) | ORAL | 0 refills | Status: AC

## 2020-05-13 NOTE — ED Triage Notes (Signed)
Pt reports having insect bite to R forearm 5 days ago. sts the area has been itching, have redness, swollen and warm to touch. Denies fever.

## 2020-05-13 NOTE — ED Provider Notes (Signed)
Angela Campbell    CSN: [id number] Arrival date & time: 05/13/20  1102      History   Chief Complaint Chief Complaint  Patient presents with  . Insect Bite    HPI Angela Campbell is a 24 y.o. female.   Patient presents with 5-day history of redness, swelling, tenderness, itching on her right forearm.  She believes it is an insect bite.  No drainage.  She denies fever, difficulty swallowing, shortness of breath, abdominal pain, numbness, weakness, tingling, or other symptoms.  OTC treatment attempted at home.  The history is provided by the patient.    Past Medical History:  Diagnosis Date  . Anxiety     Patient Active Problem List   Diagnosis Date Noted  . Acute appendicitis 05/16/2018  . Weight gain 09/30/2015  . Acquired hypothyroidism 09/30/2015    Past Surgical History:  Procedure Laterality Date  . LAPAROSCOPIC APPENDECTOMY N/A 05/15/2018   Procedure: APPENDECTOMY LAPAROSCOPIC;  Surgeon: Sung Amabile, DO;  Location: [facility] ORS;  Service: General;  Laterality: N/A;  . WISDOM TOOTH EXTRACTION      OB History   No obstetric history on file.      Home Medications    Prior to Admission medications   Medication Sig Start Date End Date Taking? Authorizing Provider  Azelastine HCl 0.15 % SOLN  08/21/19   [provider]  BLISOVI 24 FE 1-20 MG-MCG(24) tablet Take 1 tablet by mouth daily. 05/04/18   [provider]  cephALEXin (KEFLEX) 500 MG capsule Take 1 capsule (500 mg total) by mouth 4 (four) times daily. 05/13/20   Mickie Bail, NP  cetirizine (ZYRTEC) 10 MG tablet Take 10 mg by mouth daily. 04/18/18   [provider]  fluticasone Aleda Grana) 50 MCG/ACT nasal spray SMARTSIG:2 Puff(s) Both Nares Daily 07/29/19   [provider]    Family History No family history on file.  Social History Social History   Tobacco Use  . Smoking status: Never Smoker  . Smokeless tobacco: Never Used  Vaping Use  . Vaping Use: Never  used  Substance Use Topics  . Alcohol use: Yes    Alcohol/week: 1.0 - 2.0 standard drink    Types: 1 - 2 Glasses of wine per week    Comment: occ  . Drug use: Never     Allergies   Patient has no known allergies.   Review of Systems Review of Systems  Constitutional: Negative for chills and fever.  HENT: Negative for ear pain and sore throat.   Eyes: Negative for pain and visual disturbance.  Respiratory: Negative for cough and shortness of breath.   Cardiovascular: Negative for chest pain and palpitations.  Gastrointestinal: Negative for abdominal pain and vomiting.  Genitourinary: Negative for dysuria and hematuria.  Musculoskeletal: Negative for arthralgias and back pain.  Skin: Positive for color change and wound.  Neurological: Negative for seizures, syncope, weakness and numbness.  All other systems reviewed and are negative.    Physical Exam Triage Vital Signs ED Triage Vitals [05/13/20 1110]  Enc Vitals Group     BP 118/78     Pulse Rate 91     Resp 16     Temp 98.6 F (37 C)     Temp Source Oral     SpO2 98 %     Weight 161 lb (73 kg)     Height 5\' 6"  (1.676 m)     Head Circumference      Peak Flow  Pain Score 0     Pain Loc      Pain Edu?      Excl. in GC?    No data found.  Updated Vital Signs BP 118/78   Pulse 91   Temp 98.6 F (37 C) (Oral)   Resp 16   Ht 5\' 6"  (1.676 m)   Wt 161 lb (73 kg)   LMP 05/04/2020   SpO2 98%   BMI 25.99 kg/m   Visual Acuity Right Eye Distance:   Left Eye Distance:   Bilateral Distance:    Right Eye Near:   Left Eye Near:    Bilateral Near:     Physical Exam Vitals and nursing note reviewed.  Constitutional:      General: She is not in acute distress.    Appearance: She is well-developed. She is not ill-appearing.  HENT:     Head: Normocephalic and atraumatic.     Mouth/Throat:     Mouth: Mucous membranes are moist.     Pharynx: Oropharynx is clear.     Comments: No oropharyngeal swelling.   Speech clear.  No difficulty swallowing. Eyes:     Conjunctiva/sclera: Conjunctivae normal.  Cardiovascular:     Rate and Rhythm: Normal rate and regular rhythm.     Heart sounds: No murmur heard.   Pulmonary:     Effort: Pulmonary effort is normal. No respiratory distress.     Breath sounds: Normal breath sounds. No stridor. No wheezing or rhonchi.  Abdominal:     Palpations: Abdomen is soft.     Tenderness: There is no abdominal tenderness.  Musculoskeletal:        General: Normal range of motion.     Cervical back: Neck supple.  Skin:    General: Skin is warm and dry.     Capillary Refill: Capillary refill takes less than 2 seconds.     Findings: Erythema and lesion present.     Comments: 5.5 x 4 cm area of erythema with central lesion; no drainage.  See pictures for details.    Neurological:     General: No focal deficit present.     Mental Status: She is alert and oriented to person, place, and time.     Sensory: No sensory deficit.     Motor: No weakness.     Gait: Gait normal.  Psychiatric:        Mood and Affect: Mood normal.        Behavior: Behavior normal.          UC Treatments / Results  Labs (all labs ordered are listed, but only abnormal results are displayed) Labs Reviewed - No data to display  EKG   Radiology No results found.  Procedures Procedures (including critical care time)  Medications Ordered in UC Medications - No data to display  Initial Impression / Assessment and Plan / UC Course  I have reviewed the triage vital signs and the nursing notes.  Pertinent labs & imaging results that were available during my care of the patient were reviewed by me and considered in my medical decision making (see chart for details).   Cellulitis of right forearm, insect bite.  Treating with Keflex.  Instructed patient to follow-up with her PCP if her symptoms are not improving.  Instructed her to return here or go to the ED if she has signs of  worsening infection such as increased redness, red streaks, fever, or other symptoms.  Patient agrees to plan of  care.   Final Clinical Impressions(s) / UC Diagnoses   Final diagnoses:  Cellulitis of right forearm  Insect bite of right forearm, initial encounter     Discharge Instructions     Take the antibiotic as directed.    Follow up with your primary care provider if your symptoms are not improving.       ED Prescriptions    Medication Sig Dispense Auth. Provider   cephALEXin (KEFLEX) 500 MG capsule Take 1 capsule (500 mg total) by mouth 4 (four) times daily. 28 capsule Mickie Bail, NP     PDMP not reviewed this encounter.   Mickie Bail, NP 05/13/20 1142

## 2020-05-13 NOTE — Discharge Instructions (Signed)
Take the antibiotic as directed.  Follow up with your primary care provider if your symptoms are not improving.
